# Patient Record
Sex: Male | Born: 1990 | Race: Black or African American | Hispanic: No | Marital: Single | Smoking: Current every day smoker
Health system: Southern US, Community
[De-identification: ages and names within clinical notes are randomized; demographics above are authoritative.]

## PROBLEM LIST (undated history)

## (undated) DIAGNOSIS — J45909 Unspecified asthma, uncomplicated: Secondary | ICD-10-CM

## (undated) HISTORY — PX: WRIST SURGERY: SHX841

---
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
## 2011-04-04 LAB — URINALYSIS, ROUTINE W REFLEX MICROSCOPIC
Glucose, UA: NEGATIVE
Hgb urine dipstick: NEGATIVE
Ketones, ur: >80 — AB
Leukocytes, UA: NEGATIVE
Nitrite: NEGATIVE
Protein, ur: >300 — AB
Specific Gravity, Urine: 1.041 — ABNORMAL HIGH
Urobilinogen, UA: 1
pH: 6

## 2011-04-04 LAB — URINE MICROSCOPIC-ADD ON

## 2012-10-05 ENCOUNTER — Emergency Department (HOSPITAL_COMMUNITY)
Admission: EM | Admit: 2012-10-05 | Discharge: 2012-10-06 | Disposition: A | Discharge status: Home / Self Care | Payer: Self-pay | Attending: Emergency Medicine | Admitting: Emergency Medicine

## 2012-10-05 ENCOUNTER — Encounter (HOSPITAL_COMMUNITY): Payer: Self-pay | Admitting: *Deleted

## 2012-10-05 DIAGNOSIS — R42 Dizziness and giddiness: Secondary | ICD-10-CM | POA: Insufficient documentation

## 2012-10-05 DIAGNOSIS — R5381 Other malaise: Secondary | ICD-10-CM | POA: Insufficient documentation

## 2012-10-05 DIAGNOSIS — R110 Nausea: Secondary | ICD-10-CM | POA: Insufficient documentation

## 2012-10-05 DIAGNOSIS — R61 Generalized hyperhidrosis: Secondary | ICD-10-CM | POA: Insufficient documentation

## 2012-10-05 DIAGNOSIS — I951 Orthostatic hypotension: Secondary | ICD-10-CM | POA: Insufficient documentation

## 2012-10-05 DIAGNOSIS — F17200 Nicotine dependence, unspecified, uncomplicated: Secondary | ICD-10-CM | POA: Insufficient documentation

## 2012-10-05 DIAGNOSIS — R55 Syncope and collapse: Secondary | ICD-10-CM | POA: Insufficient documentation

## 2012-10-05 NOTE — ED Provider Notes (Signed)
History     CSN: [id number]  Arrival date & time 10/05/12  2256   First MD Initiated Contact with Patient 10/05/12 2346      Chief Complaint  Patient presents with  . Loss of Consciousness    (Consider location/radiation/quality/duration/timing/severity/associated sxs/prior treatment) HPI Comments: Patient presents after syncopal episode at his mother's house. He reports sleeping his mother's bed and waking up feeling hot and sweaty. Without to the bathroom and fell lightheaded and broke out in a sweat. He sat on the toilet and then passed out. His mother caught him and he did not hit his head. Denies any chest pain or shortness of breath. Denies any seizure activity, tongue biting or urinary incontinence. Good by mouth intake and urine output. No palpitations, weakness, numbness or tingling, headache or vision change.  The history is provided by the patient and a parent.    History reviewed. No pertinent past medical history.  History reviewed. No pertinent past surgical history.  History reviewed. No pertinent family history.  History  Substance Use Topics  . Smoking status: Light Tobacco Smoker  . Smokeless tobacco: Not on file  . Alcohol Use: Yes      Review of Systems  Constitutional: Positive for diaphoresis. Negative for fever, activity change and appetite change.  HENT: Negative for congestion and rhinorrhea.   Respiratory: Negative for cough, chest tightness and shortness of breath.   Cardiovascular: Positive for syncope. Negative for chest pain.  Gastrointestinal: Negative for nausea, vomiting and abdominal pain.  Genitourinary: Negative for dysuria and hematuria.  Musculoskeletal: Negative for back pain.  Skin: Negative for rash.  Neurological: Positive for dizziness, weakness and light-headedness. Negative for headaches.  A complete 10 system review of systems was obtained and all systems are negative except as noted in the HPI and PMH.    Allergies   Review of patient's allergies indicates no known allergies.  Home Medications  No current outpatient prescriptions on file.  BP 119/57  Pulse 91  Temp(Src) 98.8 F (37.1 C) (Oral)  Resp 24  SpO2 98%  Physical Exam  Constitutional: He is oriented to person, place, and time. He appears well-developed and well-nourished. No distress.  HENT:  Head: Normocephalic and atraumatic.  Mouth/Throat: Oropharynx is clear and moist. No oropharyngeal exudate.  Eyes: Conjunctivae and EOM are normal. Pupils are equal, round, and reactive to light.  Neck: Normal range of motion. Neck supple.  Cardiovascular: Normal rate, regular rhythm and normal heart sounds.   No murmur heard. Pulmonary/Chest: Effort normal and breath sounds normal. No respiratory distress.  Abdominal: Soft. There is no tenderness. There is no rebound and no guarding.  Musculoskeletal: Normal range of motion. He exhibits no edema and no tenderness.  Neurological: He is alert and oriented to person, place, and time. No cranial nerve deficit. He exhibits normal muscle tone. Coordination normal.  CN 2-12 intact, 5/5 strength throughout, no ataxia on finger to nose.  Skin: Skin is warm.    ED Course  Procedures (including critical care time)  Labs Reviewed  CBC WITH DIFFERENTIAL - Abnormal; Notable for the following:    WBC 12.8 (*)    Neutrophils Relative 89 (*)    Neutro Abs 11.4 (*)    Lymphocytes Relative 5 (*)    All other components within normal limits  URINALYSIS, ROUTINE W REFLEX MICROSCOPIC - Abnormal; Notable for the following:    Specific Gravity, Urine 1.035 (*)    Protein, ur 30 (*)    All other components  within normal limits  URINE RAPID DRUG SCREEN (HOSP PERFORMED) - Abnormal; Notable for the following:    Tetrahydrocannabinol POSITIVE (*)    All other components within normal limits  COMPREHENSIVE METABOLIC PANEL  TROPONIN I  URINE MICROSCOPIC-ADD ON   Dg Chest 2 View  10/06/2012  *RADIOLOGY REPORT*   Clinical Data: Syncope.  CHEST - 2 VIEW  Comparison: 08/24/2004.  Findings:  Cardiopericardial silhouette within normal limits. Mediastinal contours normal. Trachea midline.  No airspace disease or effusion. Monitoring leads are projected over the chest.  IMPRESSION: No active cardiopulmonary disease.   Original Report Authenticated By: Andreas Newport, M.D.      1. Orthostatic hypotension   2. Syncope       MDM  Witnessed syncopal episode with prodrome of nausea, sweating, lightheadedness. Nonfocal neuro exam.  No arrhythmias an EKG. No Brugada. Patient tolerating by mouth in the ED. Orthostatics are positive. Electrolyte are within normal limits. He is feeling better, tolerating by mouth and ambulatory. THC +    Date: 10/06/2012  Rate: 74  Rhythm: normal sinus rhythm  QRS Axis: normal  Intervals: normal  ST/T Wave abnormalities: normal  Conduction Disutrbances:none  Narrative Interpretation: no Brugada, no prolonged QT  Old EKG Reviewed: none available    Glynn Octave, MD 10/06/12 [phone]

## 2012-10-06 ENCOUNTER — Emergency Department (HOSPITAL_COMMUNITY): Payer: Self-pay

## 2012-10-06 LAB — COMPREHENSIVE METABOLIC PANEL
ALT: 17 U/L (ref 0–53)
Albumin: 4.1 g/dL (ref 3.5–5.2)
Alkaline Phosphatase: 72 U/L (ref 39–117)
BUN: 16 mg/dL (ref 6–23)
Calcium: 9.2 mg/dL (ref 8.4–10.5)
Potassium: 3.9 mEq/L (ref 3.5–5.1)
Sodium: 135 mEq/L (ref 135–145)
Total Protein: 8.2 g/dL (ref 6.0–8.3)

## 2012-10-06 LAB — URINALYSIS, ROUTINE W REFLEX MICROSCOPIC
Bilirubin Urine: NEGATIVE
Nitrite: NEGATIVE
Specific Gravity, Urine: 1.035 — ABNORMAL HIGH (ref 1.005–1.030)
pH: 6.5 (ref 5.0–8.0)

## 2012-10-06 LAB — TROPONIN I: Troponin I: <0.3 ng/mL (ref <0.30)

## 2012-10-06 LAB — CBC WITH DIFFERENTIAL/PLATELET
Basophils Relative: 0 % (ref 0–1)
Eosinophils Absolute: 0 10*3/uL (ref 0.0–0.7)
MCH: 27.6 pg (ref 26.0–34.0)
MCHC: 34.3 g/dL (ref 30.0–36.0)
Neutrophils Relative %: 89 % — ABNORMAL HIGH (ref 43–77)
Platelets: 294 10*3/uL (ref 150–400)

## 2012-10-06 LAB — RAPID URINE DRUG SCREEN, HOSP PERFORMED
Barbiturates: NOT DETECTED
Benzodiazepines: NOT DETECTED
Cocaine: NOT DETECTED
Opiates: NOT DETECTED

## 2012-10-06 LAB — URINE MICROSCOPIC-ADD ON

## 2012-10-06 NOTE — ED Notes (Signed)
Pt reports feeling cold earlier this evening.  Pt went bathroom and pt became really hot and tried to stand up.  The next thing the pt remembers is someone calling his name.

## 2012-10-06 NOTE — ED Notes (Signed)
Pt able to tolerate sprite with no difficulties.

## 2013-01-21 ENCOUNTER — Encounter (HOSPITAL_COMMUNITY): Payer: Self-pay | Admitting: Emergency Medicine

## 2013-01-21 ENCOUNTER — Emergency Department (HOSPITAL_COMMUNITY): Payer: Self-pay

## 2013-01-21 ENCOUNTER — Emergency Department (HOSPITAL_COMMUNITY)
Admission: EM | Admit: 2013-01-21 | Discharge: 2013-01-21 | Disposition: A | Discharge status: Home / Self Care | Payer: Self-pay | Attending: Emergency Medicine | Admitting: Emergency Medicine

## 2013-01-21 DIAGNOSIS — R110 Nausea: Secondary | ICD-10-CM | POA: Insufficient documentation

## 2013-01-21 DIAGNOSIS — J069 Acute upper respiratory infection, unspecified: Secondary | ICD-10-CM | POA: Insufficient documentation

## 2013-01-21 DIAGNOSIS — R51 Headache: Secondary | ICD-10-CM | POA: Insufficient documentation

## 2013-01-21 DIAGNOSIS — J029 Acute pharyngitis, unspecified: Secondary | ICD-10-CM | POA: Insufficient documentation

## 2013-01-21 DIAGNOSIS — J45901 Unspecified asthma with (acute) exacerbation: Secondary | ICD-10-CM | POA: Insufficient documentation

## 2013-01-21 DIAGNOSIS — J3489 Other specified disorders of nose and nasal sinuses: Secondary | ICD-10-CM | POA: Insufficient documentation

## 2013-01-21 DIAGNOSIS — R197 Diarrhea, unspecified: Secondary | ICD-10-CM | POA: Insufficient documentation

## 2013-01-21 DIAGNOSIS — R05 Cough: Secondary | ICD-10-CM | POA: Insufficient documentation

## 2013-01-21 DIAGNOSIS — R059 Cough, unspecified: Secondary | ICD-10-CM | POA: Insufficient documentation

## 2013-01-21 DIAGNOSIS — B9789 Other viral agents as the cause of diseases classified elsewhere: Secondary | ICD-10-CM | POA: Insufficient documentation

## 2013-01-21 DIAGNOSIS — R0789 Other chest pain: Secondary | ICD-10-CM | POA: Insufficient documentation

## 2013-01-21 DIAGNOSIS — R509 Fever, unspecified: Secondary | ICD-10-CM | POA: Insufficient documentation

## 2013-01-21 DIAGNOSIS — Z79899 Other long term (current) drug therapy: Secondary | ICD-10-CM | POA: Insufficient documentation

## 2013-01-21 DIAGNOSIS — B349 Viral infection, unspecified: Secondary | ICD-10-CM

## 2013-01-21 HISTORY — DX: Unspecified asthma, uncomplicated: J45.909

## 2013-01-21 MED ORDER — GUAIFENESIN ER 600 MG PO TB12: 1200.0000 mg | ORAL_TABLET | Freq: Two times a day (BID) | ORAL | Status: DC

## 2013-01-21 MED ORDER — OXYMETAZOLINE HCL 0.05 % NA SOLN
1.0000 | Freq: Once | NASAL | Status: AC
  Administered 2013-01-21: 1 via NASAL
  Filled 2013-01-21: qty 15

## 2013-01-21 MED ORDER — IPRATROPIUM BROMIDE 0.03 % NA SOLN: 2.0000 | Freq: Two times a day (BID) | NASAL | Status: DC

## 2013-01-21 MED ORDER — PREDNISONE 20 MG PO TABS: 40.0000 mg | ORAL_TABLET | Freq: Every day | ORAL | Status: DC

## 2013-01-21 MED ORDER — BENZONATATE 100 MG PO CAPS: 100.0000 mg | ORAL_CAPSULE | Freq: Three times a day (TID) | ORAL | Status: DC

## 2013-01-21 NOTE — ED Provider Notes (Signed)
History    This chart was scribed for a non-physician practitioner working with Derek Munch, MD by Derek Good, ED scribe. This patient was seen in room WTR7/WTR7 and the patient's care was started at 6:13 PM.  CSN: [id number] Arrival date & time 01/21/13  1725    Chief Complaint  Patient presents with  . Generalized Body Aches  . Nausea  . Fever     The history is provided by the patient and medical records. No language interpreter was used.   HPI Comments: Derek Good is a 22 y.o. male with a hx of asthma who does not take daily medicationho presents to the Emergency Department complaining of pain to head and ears onset one day ago.  He complains of associated sore throat, cough, fever, nausea, diarrhea, chills and generalized body aches.  He reports taking zicam with some relief and nothing seems to make it worse.  Pt denies diaphoresis, vomiting, weakness, SOB and any other pain.   Past Medical History  Diagnosis Date  . Asthma    Past Surgical History  Procedure Laterality Date  . Wrist surgery     Family History  Problem Relation Age of Onset  . Asthma Mother    History  Substance Use Topics  . Smoking status: Light Tobacco Smoker  . Smokeless tobacco: Not on file  . Alcohol Use: Yes    Review of Systems  Constitutional: Positive for chills and fatigue. Negative for fever and appetite change.  HENT: Positive for congestion, sore throat, rhinorrhea, postnasal drip and sinus pressure. Negative for ear pain, mouth sores, neck stiffness and ear discharge.   Eyes: Negative for visual disturbance.  Respiratory: Positive for cough, chest tightness, shortness of breath and wheezing. Negative for stridor.   Cardiovascular: Negative for chest pain, palpitations and leg swelling.  Gastrointestinal: Positive for nausea and diarrhea. Negative for vomiting and abdominal pain.  Genitourinary: Negative for dysuria, urgency, frequency and hematuria.  Musculoskeletal:  Negative for myalgias, back pain and arthralgias.  Skin: Negative for rash.  Neurological: Positive for headaches. Negative for syncope, light-headedness and numbness.  Hematological: Negative for adenopathy.  Psychiatric/Behavioral: The patient is not nervous/anxious.   All other systems reviewed and are negative.    Allergies  Review of patient's allergies indicates no known allergies.  Home Medications   Current Outpatient Rx  Name  Route  Sig  Dispense  Refill  . benzonatate (TESSALON) 100 MG capsule   Oral   Take 1 capsule (100 mg total) by mouth every 8 (eight) hours.   21 capsule   0   . guaiFENesin (MUCINEX) 600 MG 12 hr tablet   Oral   Take 2 tablets (1,200 mg total) by mouth 2 (two) times daily.   30 tablet   0   . ipratropium (ATROVENT) 0.03 % nasal spray   Nasal   Place 2 sprays into the nose 2 (two) times daily. PRN congestion   30 mL   0    BP 127/62  Pulse 96  Temp(Src) 98.8 F (37.1 C) (Oral)  Wt 250 lb (113.399 kg)  SpO2 100% Physical Exam  Nursing note and vitals reviewed. Constitutional: He is oriented to person, place, and time. He appears well-developed and well-nourished. No distress.  Awake, alert, nontoxic appearance  HENT:  Head: Normocephalic and atraumatic.  Right Ear: Tympanic membrane, external ear and ear canal normal.  Left Ear: Tympanic membrane, external ear and ear canal normal.  Nose: Mucosal edema and rhinorrhea present.  No epistaxis. Right sinus exhibits no maxillary sinus tenderness and no frontal sinus tenderness. Left sinus exhibits no maxillary sinus tenderness and no frontal sinus tenderness.  Mouth/Throat: Uvula is midline and mucous membranes are normal. Mucous membranes are not pale and not cyanotic. Posterior oropharyngeal edema (mild) and posterior oropharyngeal erythema (mild) present. No oropharyngeal exudate or tonsillar abscesses.  Erythematous throat.  Some rhinorrhea and nasal congestion.  Eyes: Conjunctivae are  normal. Pupils are equal, round, and reactive to light. No scleral icterus.  Neck: Normal range of motion and full passive range of motion without pain. Neck supple.  Cardiovascular: Normal rate, regular rhythm, normal heart sounds and intact distal pulses.   No murmur heard. Pulmonary/Chest: Effort normal. No accessory muscle usage or stridor. Not tachypneic. No respiratory distress. He has decreased breath sounds. He has wheezes. He has no rhonchi. He has no rales.  Mild wheezing heard throughout.  Abdominal: Soft. Bowel sounds are normal. He exhibits no distension and no mass. There is no tenderness. There is no rebound and no guarding.  Musculoskeletal: Normal range of motion. He exhibits no edema.  Lymphadenopathy:    He has no cervical adenopathy.  Neurological: He is alert and oriented to person, place, and time. He exhibits normal muscle tone. Coordination normal.  Speech is clear and goal oriented Moves extremities without ataxia  Skin: Skin is warm and dry. No rash noted. He is not diaphoretic. No erythema.  Psychiatric: He has a normal mood and affect.    ED Course  Procedures (including critical care time) DIAGNOSTIC STUDIES: Oxygen Saturation is 100% on RA, normal by my interpretation.    COORDINATION OF CARE: 6:15 PM - Discussed ED treatment with pt at bedside including chest x-ray, albuterol, and decongestant and pt agrees.     Labs Reviewed - No data to display Dg Chest 2 View  01/21/2013   *RADIOLOGY REPORT*  Clinical Data: Cough and wheezing  CHEST - 2 VIEW  Comparison: 10/06/2012  Findings: Lung volume is normal.  The lungs are clear without infiltrate or effusion.  Heart size and vascularity are normal.  No mass lesion is identified.  IMPRESSION: Negative   Original Report Authenticated By: Janeece Riggers, M.D.   1. URI (upper respiratory infection)   2. Viral syndrome     MDM  Derek Good presents with URI symptoms and a Hx of asthma.  Pt CXR negative  for acute infiltrate. I personally reviewed the imaging tests through PACS system.  I reviewed available ER/hospitalization records through the EMR.  Patient ambulated in ED with O2 saturations maintained >90, no current signs of respiratory distress. Lung exam improved after albuterol treatment. Prednisone  5 day burst given along with symptomatic treatment. Pt states they are breathing at baseline. Patients symptoms are consistent with URI, likely viral etiology. Discussed that antibiotics are not indicated for viral infections. Pt will be discharged with symptomatic treatment.  Pt has been instructed to speak with PCP about today's exacerbation. Verbalizes understanding and is agreeable with plan. Pt is hemodynamically stable & in NAD prior to dc.  I personally performed the services described in this documentation, which was scribed in my presence. The recorded information has been reviewed and is accurate.     Dierdre Forth, PA-C 01/21/13 1857

## 2013-01-21 NOTE — ED Notes (Signed)
Pt reports fever. Nausea, body aches x 1 day. Tx with OTC meds

## 2013-01-21 NOTE — ED Provider Notes (Signed)
  Medical screening examination/treatment/procedure(s) were performed by non-physician practitioner and as supervising physician I was immediately available for consultation/collaboration.    Gerhard Munch, MD 01/21/13 2337

## 2013-06-27 ENCOUNTER — Emergency Department (HOSPITAL_COMMUNITY): Payer: Self-pay

## 2013-06-27 ENCOUNTER — Encounter (HOSPITAL_COMMUNITY): Payer: Self-pay | Admitting: Emergency Medicine

## 2013-06-27 ENCOUNTER — Emergency Department (HOSPITAL_COMMUNITY)
Admission: EM | Admit: 2013-06-27 | Discharge: 2013-06-27 | Disposition: A | Discharge status: Home / Self Care | Payer: Self-pay | Attending: Emergency Medicine | Admitting: Emergency Medicine

## 2013-06-27 DIAGNOSIS — S62319A Displaced fracture of base of unspecified metacarpal bone, initial encounter for closed fracture: Secondary | ICD-10-CM | POA: Insufficient documentation

## 2013-06-27 DIAGNOSIS — S62301A Unspecified fracture of second metacarpal bone, left hand, initial encounter for closed fracture: Secondary | ICD-10-CM

## 2013-06-27 DIAGNOSIS — R296 Repeated falls: Secondary | ICD-10-CM | POA: Insufficient documentation

## 2013-06-27 DIAGNOSIS — J45909 Unspecified asthma, uncomplicated: Secondary | ICD-10-CM | POA: Insufficient documentation

## 2013-06-27 DIAGNOSIS — S62202A Unspecified fracture of first metacarpal bone, left hand, initial encounter for closed fracture: Secondary | ICD-10-CM

## 2013-06-27 DIAGNOSIS — S62233A Other displaced fracture of base of first metacarpal bone, unspecified hand, initial encounter for closed fracture: Secondary | ICD-10-CM | POA: Insufficient documentation

## 2013-06-27 DIAGNOSIS — F17200 Nicotine dependence, unspecified, uncomplicated: Secondary | ICD-10-CM | POA: Insufficient documentation

## 2013-06-27 DIAGNOSIS — Y9367 Activity, basketball: Secondary | ICD-10-CM | POA: Insufficient documentation

## 2013-06-27 DIAGNOSIS — Y9239 Other specified sports and athletic area as the place of occurrence of the external cause: Secondary | ICD-10-CM | POA: Insufficient documentation

## 2013-06-27 MED ORDER — OXYCODONE-ACETAMINOPHEN 5-325 MG PO TABS
2.0000 | ORAL_TABLET | Freq: Once | ORAL | Status: AC
  Administered 2013-06-27: 2 via ORAL
  Filled 2013-06-27: qty 2

## 2013-06-27 MED ORDER — OXYCODONE-ACETAMINOPHEN 5-325 MG PO TABS: 1.0000 | ORAL_TABLET | Freq: Four times a day (QID) | ORAL | Status: DC | PRN

## 2013-06-27 NOTE — ED Notes (Signed)
Playing basketball and fell, landing on left hand injuring thumb and pointer finger. Swelling noted. CMS intact.

## 2013-06-27 NOTE — ED Notes (Signed)
Ortho tech paged  

## 2013-06-27 NOTE — ED Notes (Signed)
Pt playing basketball and fell on his left hand, unable to move two of his fingers, swelling present

## 2013-06-27 NOTE — ED Notes (Signed)
Ortho Tech at bedside.  

## 2013-06-27 NOTE — ED Provider Notes (Signed)
CSN: [id number]     Arrival date & time 06/27/13  1926 History   First MD Initiated Contact with Patient 06/27/13 2008     Chief Complaint  Patient presents with  . Hand Injury   HPI  History provided by the patient. Patient is a 22 year old male with past history of asthma who presents with complaints of left hand injury and pain. Patient was playing basketball lost his balance and fell backwards landing on his left hand. Patient believes his him it didn't twist it and initially struck the dorsal aspect. He has had pain near the base of his thumb and index finger of the hand. Pain is worse with any movements especially of the thumb. It has been also associated with swelling. He denies any weakness or numbness. He has put ice over the area with minimal improvement of swelling. No other aggravating or alleviating factors. No other injury from fall.   Past Medical History  Diagnosis Date  . Asthma    Past Surgical History  Procedure Laterality Date  . Wrist surgery     Family History  Problem Relation Age of Onset  . Asthma Mother    History  Substance Use Topics  . Smoking status: Light Tobacco Smoker  . Smokeless tobacco: Not on file  . Alcohol Use: Yes    Review of Systems  Neurological: Negative for weakness and numbness.  All other systems reviewed and are negative.    Allergies  Review of patient's allergies indicates no known allergies.  Home Medications   Current Outpatient Rx  Name  Route  Sig  Dispense  Refill  . Phenyleph-CPM-DM-APAP (TYLENOL COLD MULTI-SYMPTOM PO)   Oral   Take 1 tablet by mouth every 4 (four) hours as needed (cold symptoms.).          BP 121/70  Pulse 100  Temp(Src) 99.7 F (37.6 C) (Oral)  Resp 20  Wt 245 lb (111.131 kg)  SpO2 100% Physical Exam  Nursing note and vitals reviewed. Constitutional: He appears well-developed and well-nourished. No distress.  HENT:  Head: Normocephalic.  Cardiovascular: Normal rate and regular  rhythm.   Pulmonary/Chest: Effort normal and breath sounds normal. No respiratory distress.  Musculoskeletal: He exhibits edema and tenderness.  Significant reduced range of motion of the first and second digits of the left hand. There is swelling between the spacing of the left thumb and index finger. Normal distal sensation select touch normal capillary refill. No gross deformities.  Neurological: He is alert.  Skin: Skin is warm.  Psychiatric: He has a normal mood and affect. His behavior is normal.    ED Course  Procedures   DIAGNOSTIC STUDIES: Oxygen Saturation is 100% on room air.    COORDINATION OF CARE:  Nursing notes reviewed. Vital signs reviewed. Initial pt interview and examination performed.   9:00 p.m. patient seen and evaluated. Patient appears in some discomfort no acute distress. Discussed work up plan with pt at bedside, which includes x-rays. Pt agrees with plan.  9:20 PM spoke with Dr. Melvyn Novas with orthopedic hand. He would like the patient placed in a splints and to followup in the office tomorrow.  Treatment plan initiated: Medications  oxyCODONE-acetaminophen (PERCOCET/ROXICET) 5-325 MG per tablet 2 tablet (2 tablets Oral Given 06/27/13 2056)   Imaging Review Dg Hand Complete Left  06/27/2013   CLINICAL DATA:  Larey Seat playing basketball.  Pain and swelling.  EXAM: LEFT HAND - COMPLETE 3+ VIEW  COMPARISON:  None.  FINDINGS: There are nondisplaced  fractures involving the base of the 1st and 2nd metacarpals. The remaining bony structures are intact.  IMPRESSION: First and 2nd metacarpal base fractures.   Electronically Signed   By: Loralie Champagne M.D.   On: 06/27/2013 20:33      MDM   1. First metacarpal bone fracture, left, closed, initial encounter   2. Fracture of second metacarpal bone of left hand, closed, initial encounter        Angus Seller, PA-C 06/28/13 [phone]

## 2013-06-27 NOTE — ED Notes (Signed)
Patient transported to X-ray 

## 2013-06-28 NOTE — ED Provider Notes (Signed)
Medical screening examination/treatment/procedure(s) were performed by non-physician practitioner and as supervising physician I was immediately available for consultation/collaboration.  EKG Interpretation   None         William Lakeem Rozo, MD 06/28/13 1655 

## 2013-06-29 ENCOUNTER — Encounter: Payer: Self-pay | Admitting: Internal Medicine

## 2013-06-29 ENCOUNTER — Ambulatory Visit: Payer: Self-pay | Attending: Internal Medicine | Admitting: Internal Medicine

## 2013-06-29 VITALS — BP 132/80 | HR 70 | Temp 98.6°F | Resp 16 | Ht 76.0 in | Wt 246.0 lb

## 2013-06-29 DIAGNOSIS — IMO0001 Reserved for inherently not codable concepts without codable children: Secondary | ICD-10-CM

## 2013-06-29 DIAGNOSIS — S42309S Unspecified fracture of shaft of humerus, unspecified arm, sequela: Secondary | ICD-10-CM

## 2013-06-29 DIAGNOSIS — S62301S Unspecified fracture of second metacarpal bone, left hand, sequela: Secondary | ICD-10-CM

## 2013-06-29 DIAGNOSIS — S62301A Unspecified fracture of second metacarpal bone, left hand, initial encounter for closed fracture: Secondary | ICD-10-CM | POA: Insufficient documentation

## 2013-06-29 DIAGNOSIS — Z23 Encounter for immunization: Secondary | ICD-10-CM

## 2013-06-29 DIAGNOSIS — M79642 Pain in left hand: Secondary | ICD-10-CM

## 2013-06-29 DIAGNOSIS — S62202A Unspecified fracture of first metacarpal bone, left hand, initial encounter for closed fracture: Secondary | ICD-10-CM | POA: Insufficient documentation

## 2013-06-29 DIAGNOSIS — M79609 Pain in unspecified limb: Secondary | ICD-10-CM | POA: Insufficient documentation

## 2013-06-29 DIAGNOSIS — F17200 Nicotine dependence, unspecified, uncomplicated: Secondary | ICD-10-CM | POA: Insufficient documentation

## 2013-06-29 DIAGNOSIS — Z139 Encounter for screening, unspecified: Secondary | ICD-10-CM

## 2013-06-29 DIAGNOSIS — S62202S Unspecified fracture of first metacarpal bone, left hand, sequela: Secondary | ICD-10-CM

## 2013-06-29 LAB — COMPLETE METABOLIC PANEL WITH GFR
Albumin: 4.3 g/dL (ref 3.5–5.2)
BUN: 11 mg/dL (ref 6–23)
CO2: 22 mEq/L (ref 19–32)
Calcium: 9.4 mg/dL (ref 8.4–10.5)
Chloride: 103 mEq/L (ref 96–112)
GFR, Est Non African American: >89 mL/min
Glucose, Bld: 84 mg/dL (ref 70–99)
Potassium: 3.9 mEq/L (ref 3.5–5.3)
Sodium: 136 mEq/L (ref 135–145)
Total Protein: 7.5 g/dL (ref 6.0–8.3)

## 2013-06-29 LAB — CBC WITH DIFFERENTIAL/PLATELET
HCT: 42.5 % (ref 39.0–52.0)
Hemoglobin: 14.6 g/dL (ref 13.0–17.0)
Lymphocytes Relative: 16 % (ref 12–46)
Lymphs Abs: 1.3 10*3/uL (ref 0.7–4.0)
MCHC: 34.4 g/dL (ref 30.0–36.0)
Monocytes Absolute: 1.2 10*3/uL — ABNORMAL HIGH (ref 0.1–1.0)
Monocytes Relative: 15 % — ABNORMAL HIGH (ref 3–12)
Neutro Abs: 5.7 10*3/uL (ref 1.7–7.7)
Neutrophils Relative %: 67 % (ref 43–77)
RBC: 5.43 MIL/uL (ref 4.22–5.81)
WBC: 8.4 10*3/uL (ref 4.0–10.5)

## 2013-06-29 LAB — LIPID PANEL
HDL: 38 mg/dL — ABNORMAL LOW (ref >39)
LDL Cholesterol: 90 mg/dL (ref 0–99)
Total CHOL/HDL Ratio: 3.6 Ratio

## 2013-06-29 LAB — TSH: TSH: 0.961 u[IU]/mL (ref 0.350–4.500)

## 2013-06-29 MED ORDER — IBUPROFEN 800 MG PO TABS: 800.0000 mg | ORAL_TABLET | Freq: Three times a day (TID) | ORAL | Status: DC | PRN

## 2013-06-29 NOTE — Progress Notes (Signed)
MRN: [id number] Name: Derek Good  Sex: male Age: 22 y.o. DOB: [birth year]  Allergies: Review of patient's allergies indicates no known allergies.  Chief Complaint  Patient presents with  . Establish Care    HPI: Patient is 22 y.o. male  comes for the first time to establish medical care, he recently went to the emergency room with left hand pain as patient was playing basket ball and fell on his hand, EMR reviewed patient had an x-ray done reported to have nondisplaced fractures involving the base of the 1st and 2nd metacarpals.   Hand was splinted, patient was advised to see orthopedics as patient did not had insurance could not see the orthopedic, he was prescribed narcotic pain medication has not required to use it advised patient that if he develops mild-to-moderate pain I will prescribe him ibuprofen to take. Narcotic medication he will only use for severe pain.   Past Medical History  Diagnosis Date  . Asthma     Past Surgical History  Procedure Laterality Date  . Wrist surgery        Medication List       This list is accurate as of: 06/29/13 11:38 AM.  Always use your most recent med list.               ibuprofen 800 MG tablet  Commonly known as:  ADVIL,MOTRIN  Take 1 tablet (800 mg total) by mouth every 8 (eight) hours as needed.     oxyCODONE-acetaminophen 5-325 MG per tablet  Commonly known as:  PERCOCET/ROXICET  Take 1-2 tablets by mouth every 6 (six) hours as needed for severe pain.     TYLENOL COLD MULTI-SYMPTOM PO  Take 1 tablet by mouth every 4 (four) hours as needed (cold symptoms.).        Meds ordered this encounter  Medications  . ibuprofen (ADVIL,MOTRIN) 800 MG tablet    Sig: Take 1 tablet (800 mg total) by mouth every 8 (eight) hours as needed.    Dispense:  60 tablet    Refill:  1     There is no immunization history on file for this patient.  Family History  Problem Relation Age of Onset  . Asthma Mother     History   Substance Use Topics  . Smoking status: Light Tobacco Smoker    Types: Cigarettes, Cigars  . Smokeless tobacco: Not on file  . Alcohol Use: Yes    Review of Systems  As noted in HPI  Filed Vitals:   06/29/13 1113  BP: 132/80  Pulse: 70  Temp: 98.6 F (37 C)  Resp: 16    Physical Exam  Physical Exam  Constitutional: No distress.  Eyes: EOM are normal. Pupils are equal, round, and reactive to light.  Cardiovascular: Normal rate and regular rhythm.   Pulmonary/Chest: Breath sounds normal. No respiratory distress. He has no wheezes. He has no rales.  Musculoskeletal:  Left hand in splint patient can move the fingers denies any pain, good capillary refill     CBC    Component Value Date/Time   WBC 12.8* 10/06/2012 0008   RBC 5.72 10/06/2012 0008   HGB 15.8 10/06/2012 0008   HCT 46.0 10/06/2012 0008   PLT 294 10/06/2012 0008   MCV 80.4 10/06/2012 0008   LYMPHSABS 0.7 10/06/2012 0008   MONOABS 0.7 10/06/2012 0008   EOSABS 0.0 10/06/2012 0008   BASOSABS 0.0 10/06/2012 0008    CMP     Component Value Date/Time  NA 135 10/06/2012 0008   K 3.9 10/06/2012 0008   CL 100 10/06/2012 0008   CO2 25 10/06/2012 0008   GLUCOSE 95 10/06/2012 0008   BUN 16 10/06/2012 0008   CREATININE 0.91 10/06/2012 0008   CALCIUM 9.2 10/06/2012 0008   PROT 8.2 10/06/2012 0008   ALBUMIN 4.1 10/06/2012 0008   AST 18 10/06/2012 0008   ALT 17 10/06/2012 0008   ALKPHOS 72 10/06/2012 0008   BILITOT 0.7 10/06/2012 0008   GFRNONAA >90 10/06/2012 0008   GFRAA >90 10/06/2012 0008    No results found for this basename: chol, tri, ldl    No components found with this basename: hga1c    Lab Results  Component Value Date/Time   AST 18 10/06/2012 12:08 AM    Assessment and Plan  Fracture of metacarpal, first, left hand, sequela - Plan: Ambulatory referral to Orthopedic Surgery Fracture of second metacarpal bone of left hand, sequela - Plan: Ambulatory referral to Orthopedic Surgery  Smoking  advised to quit  smoking.  Needs flu shot  Screening - Plan: CBC with Differential, COMPLETE METABOLIC PANEL WITH GFR, TSH, Lipid panel, Vit D  25 hydroxy (rtn osteoporosis monitoring)  Left hand pain - Plan: ibuprofen (ADVIL,MOTRIN) 800 MG tablet for mild to moderate pain, Percocet only for severe pain.   Health Maintenance  Return in about 5 weeks (around 08/03/2013).  Doris Cheadle, MD

## 2013-06-29 NOTE — Progress Notes (Signed)
Patient here to establish care Broke his hand a few days ago playing basketball Needs referral to ortho In the process of applying for orange card

## 2013-06-30 ENCOUNTER — Telehealth: Payer: Self-pay | Admitting: *Deleted

## 2013-06-30 ENCOUNTER — Telehealth: Payer: Self-pay | Admitting: Emergency Medicine

## 2013-06-30 ENCOUNTER — Telehealth: Payer: Self-pay

## 2013-06-30 ENCOUNTER — Other Ambulatory Visit: Payer: Self-pay | Admitting: Emergency Medicine

## 2013-06-30 LAB — VITAMIN D 25 HYDROXY (VIT D DEFICIENCY, FRACTURES): Vit D, 25-Hydroxy: <10 ng/mL — ABNORMAL LOW (ref 30–89)

## 2013-06-30 MED ORDER — VITAMIN D (ERGOCALCIFEROL) 1.25 MG (50000 UNIT) PO CAPS: 50000.0000 [IU] | ORAL_CAPSULE | ORAL | Status: DC

## 2013-06-30 NOTE — Telephone Encounter (Signed)
Pt returning call. Labs given. Pt instructed with new script Vitamin D sent to [facility]

## 2013-06-30 NOTE — Telephone Encounter (Signed)
Spoke with patient about his referrel We referred  Him to [facility] but he can not afford  The required $250.00 payment . i told him he could talk with Arna Medici  When he comes to pick up his prescription

## 2013-06-30 NOTE — Telephone Encounter (Signed)
Contacted pt to notify of lab results. Pt did not answer. Left a voicemail to please give [place] a call back.

## 2013-06-30 NOTE — Telephone Encounter (Signed)
Message copied by Lestine Mount on Thu Jun 30, 2013 12:43 PM ------      Message from: Doris Cheadle      Created: Thu Jun 30, 2013  9:16 AM       Blood work reviewed, noticed low vitamin D, call patient advise to start ergocalciferol 50,000 units once a week for the duration of  12 weeks.       ------

## 2013-07-11 ENCOUNTER — Emergency Department (HOSPITAL_COMMUNITY)
Admission: EM | Admit: 2013-07-11 | Discharge: 2013-07-11 | Disposition: A | Discharge status: Home / Self Care | Payer: Self-pay | Attending: Emergency Medicine | Admitting: Emergency Medicine

## 2013-07-11 ENCOUNTER — Encounter (HOSPITAL_COMMUNITY): Payer: Self-pay | Admitting: Emergency Medicine

## 2013-07-11 DIAGNOSIS — F17200 Nicotine dependence, unspecified, uncomplicated: Secondary | ICD-10-CM | POA: Insufficient documentation

## 2013-07-11 DIAGNOSIS — J45909 Unspecified asthma, uncomplicated: Secondary | ICD-10-CM | POA: Insufficient documentation

## 2013-07-11 DIAGNOSIS — S5290XD Unspecified fracture of unspecified forearm, subsequent encounter for closed fracture with routine healing: Secondary | ICD-10-CM | POA: Insufficient documentation

## 2013-07-11 DIAGNOSIS — Z79899 Other long term (current) drug therapy: Secondary | ICD-10-CM | POA: Insufficient documentation

## 2013-07-11 DIAGNOSIS — S62308S Unspecified fracture of other metacarpal bone, sequela: Secondary | ICD-10-CM

## 2013-07-11 DIAGNOSIS — S62202S Unspecified fracture of first metacarpal bone, left hand, sequela: Secondary | ICD-10-CM

## 2013-07-11 NOTE — ED Notes (Signed)
Patient states that he fractured 1st and 2nd metacarpal 2 weeks ago and went to [facility] at that time.   Patient had xrays done at [facility].   Patient states he went to health and wellness today to get a work note and they told him to come to ED for "a hard wrap and a note for work".

## 2013-07-11 NOTE — Progress Notes (Signed)
Orthopedic Tech Progress Note Patient Details:  Derek Good 17-Aug-1990 [id number]  Ortho Devices Type of Ortho Device: Ace wrap;Volar splint Ortho Device/Splint Location: lue Ortho Device/Splint Interventions: Application   [name] 07/11/2013, 5:47 PM

## 2013-07-11 NOTE — ED Provider Notes (Signed)
CSN: [id number]     Arrival date & time 07/11/13  1437 History   This chart was scribed for non-physician practitioner, Raymon Mutton, PA-C, working with Doug Sou, MD, by Yevette Edwards, ED Scribe. This patient was seen in room TR10C/TR10C and the patient's care was started at 4:48 PM.     Chief Complaint  Patient presents with  . fractured hand    The history is provided by the patient. No language interpreter was used.   HPI Comments: Derek Good is a 22 y.o. male who presents to the Emergency Department complaining of fractures to his first and second metacarpals of his left hand which were treated two weeks ago on December 15th, but for which he now needs a hard cast.  He states he has experienced improvement to the pain; he has been treating the pain with 800 mg IBU and percocet. The pt denies weakness to the hand, numbness, paresthesia, or additional injuries or falls. The pt also requests a work note.  After the initial injury two weeks ago, he followed-up with [facility], but he was unable to afford the cost of the appointment. The pt was referred to the ED by [facility] to see if a hard cast or another splint could be placed. He is a current smoker.   Past Medical History  Diagnosis Date  . Asthma    Past Surgical History  Procedure Laterality Date  . Wrist surgery     Family History  Problem Relation Age of Onset  . Asthma Mother    History  Substance Use Topics  . Smoking status: Current Some Day Smoker    Types: Cigarettes, Cigars  . Smokeless tobacco: Not on file  . Alcohol Use: Yes    Review of Systems  Constitutional: Negative for fever.  Musculoskeletal: Positive for arthralgias.  Neurological: Negative for weakness and numbness.  All other systems reviewed and are negative.    Allergies  Review of patient's allergies indicates no known allergies.  Home Medications   Current Outpatient Rx  Name  Route  Sig  Dispense   Refill  . ibuprofen (ADVIL,MOTRIN) 800 MG tablet   Oral   Take 1 tablet (800 mg total) by mouth every 8 (eight) hours as needed.   60 tablet   1   . oxyCODONE-acetaminophen (PERCOCET/ROXICET) 5-325 MG per tablet   Oral   Take 1-2 tablets by mouth every 6 (six) hours as needed for severe pain.   20 tablet   0   . Vitamin D, Ergocalciferol, (DRISDOL) 50000 UNITS CAPS capsule   Oral   Take 50,000 Units by mouth every 7 (seven) days.          Triage Vitals: BP 119/67  Pulse 80  Temp(Src) 97.3 F (36.3 C) (Oral)  Resp 16  SpO2 96%  Physical Exam  Nursing note and vitals reviewed. Constitutional: He is oriented to person, place, and time. He appears well-developed and well-nourished. No distress.  HENT:  Head: Normocephalic and atraumatic.  Eyes: EOM are normal.  Neck: Normal range of motion. Neck supple.  Cardiovascular: Normal rate, regular rhythm and normal heart sounds.  Exam reveals no friction rub.   No murmur heard. Pulses:      Radial pulses are 2+ on the right side.  Pulmonary/Chest: Effort normal and breath sounds normal. No respiratory distress. He has no wheezes. He has no rales.  Musculoskeletal: Normal range of motion.  Negative swelling, erythema, inflammation, lesions, sores, changes to colors noted  to the digits of the left hand. Patient currently in short arm splint to the left hand. Full range of motion noted to the digits of the left hip without difficulty. Full supination, pronation noted to the left upper extremities. Full range of motion noted to the left elbow, left shoulder.  Neurological: He is alert and oriented to person, place, and time. He exhibits normal muscle tone. Coordination normal.  Strength intact to MCP, PIP, DIP joints of the left hand. Sensation intact with differentiation to sharp and dull touch  Skin: Skin is warm and dry.  Psychiatric: He has a normal mood and affect. His behavior is normal.    ED Course  Procedures (including  critical care time)  DIAGNOSTIC STUDIES: Oxygen Saturation is 96% on room air, adequate by my interpretation.    COORDINATION OF CARE:  4:53 PM- Discussed treatment plan with patient, and the patient agreed to the plan.   4:55 PM This provider consulted with Dr. Jinger Neighbors who reported that hard casts are not normally placed in the ED setting, unless patient is seen by orthopedics and orthopedics places the hard cast on in the office. Dr. Jinger Neighbors recommended to consult with orthopedics patient was referred to.  5:04 PM This provider spoke with Drue Flirt, receptionist of [facility] orthopedics-discussed scenario and situation regarding patient being unable to afford followup. He reported the patient called and set up an appointment for 06/29/2013-reported that the patient called on 07/01/2013 reporting that he was going to be seen by the community health review is going to receive free care without charge. Candy reported that patient needs to pay $250 in order to be seen-patient cannot be seen without co-pay.   5:17 PM- Rechecked pt.   Labs Review Labs Reviewed - No data to display Imaging Review No results found.  EKG Interpretation   None       MDM   1. Fracture of metacarpal, first, left hand, sequela   2. Fracture of second metacarpal bone, sequela     Filed Vitals:   07/11/13 1453  BP: 119/67  Pulse: 80  Temp: 97.3 F (36.3 C)  TempSrc: Oral  Resp: 16  SpO2: 96%   I personally performed the services described in this documentation, which was scribed in my presence. The recorded information has been reviewed and is accurate.  Patient presenting to emergency department with first and second metacarpal base fractures that were identified on 06/27/2013-patient was placed in a short arm splint and referred to [facility] orthopedics. Patient presents today with request for hard cast to be placed and for work note. Patient reports that he was due to be seen at [facility]  orthopedics, but was unable to afford the co-pay. Patient reported that he canceled his appointment with [facility] orthopedics was following up with community health where he was receiving free care. Patient reports that he was told by [facility] to get a hard cast placed/change splint-recommended to come to emergency department for this. Alert and oriented. GCS 15. Heart rate and rhythm normal. Lungs clear to auscultation to upper and lower lobes. Negative swelling, erythema, inflammation, lesions, sores, changes in color noted to the digits of left hand. Full range of motion to the digits noted. Strength intact MCP, PIP, DIP joints of the left hand. Sensation intact. Strength intact with equal distribution. Pulses palpable. Patient currently in short arm splint. This provider spoke with [facility] orthopedics receptionist-reported that patient was due to be seen in the office on 07/01/2013 reported  $250  co-pay required - receptionist reported that patient called and canceled appointment and stated that he was going to seen community health center. Receptionist reported that he needs to pay co-pay.  Splint replaced. Patient stable, afebrile. Discharge patient to follow, and [facility]. Discussed with patient to rest, ice. Discussed with patient to avoid any physical stress activity. Work noted given. Discussed with patient to closely monitor symptoms and if symptoms are to worsen or change to report back to the ED - strict return instructions given.  Patient agreed to plan of care, understood, all questions answered.     Raymon Mutton, PA-C 07/13/13 1402

## 2013-07-11 NOTE — ED Notes (Signed)
Ortho tech here to change cast

## 2013-07-13 NOTE — ED Provider Notes (Signed)
Medical screening examination/treatment/procedure(s) were performed by non-physician practitioner and as supervising physician I was immediately available for consultation/collaboration.  EKG Interpretation   None        Arleta Ostrum, MD 07/13/13 2018 

## 2013-08-09 ENCOUNTER — Ambulatory Visit: Payer: Self-pay

## 2014-09-20 ENCOUNTER — Emergency Department (HOSPITAL_COMMUNITY)
Admission: EM | Admit: 2014-09-20 | Discharge: 2014-09-20 | Disposition: A | Discharge status: Home / Self Care | Payer: Self-pay | Attending: Emergency Medicine | Admitting: Emergency Medicine

## 2014-09-20 ENCOUNTER — Encounter (HOSPITAL_COMMUNITY): Payer: Self-pay | Admitting: Emergency Medicine

## 2014-09-20 DIAGNOSIS — Z720 Tobacco use: Secondary | ICD-10-CM | POA: Insufficient documentation

## 2014-09-20 DIAGNOSIS — J45909 Unspecified asthma, uncomplicated: Secondary | ICD-10-CM | POA: Insufficient documentation

## 2014-09-20 DIAGNOSIS — M722 Plantar fascial fibromatosis: Secondary | ICD-10-CM | POA: Insufficient documentation

## 2014-09-20 NOTE — ED Notes (Signed)
Pt made aware to return if symptoms worsen or if any life threatening symptoms occur.   

## 2014-09-20 NOTE — ED Provider Notes (Signed)
CSN: [id number]     Arrival date & time 09/20/14  1620 History  This chart was scribed for non-physician practitioner working with Noel GeroldMatt Gentry, MD by Richarda Overlieichard Holland, ED Scribe. This patient was seen in room TR05C/TR05C and the patient's care was started at 6:00 PM.  Chief Complaint  Patient presents with  . Foot Pain   The history is provided by the patient. No language interpreter was used.   HPI Comments: Derek Good is a 24 y.o. male with a history of asthma who presents to the Emergency Department complaining of bilateral foot pain for the last week. Pt denies any injury. 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 He reports that he has pain on the lateral sides of his feet and in the bottom of his heels. He says that his pain is the worst in the morning. Pt states that he has tried taking ibuprofen which has provided minimal pain relief. He denies fevers, chills, nausea, vomiting, numbness and tingling.   Past Medical History  Diagnosis Date  . Asthma    Past Surgical History  Procedure Laterality Date  . Wrist surgery     Family History  Problem Relation Age of Onset  . Asthma Mother    History  Substance Use Topics  . Smoking status: Current Some Day Smoker    Types: Cigarettes, Cigars  . Smokeless tobacco: Not on file  . Alcohol Use: Yes    Review of Systems  Constitutional: Negative for fever and chills.  Gastrointestinal: Negative for nausea and vomiting.  Musculoskeletal: Positive for arthralgias. Negative for gait problem.  Skin: Negative for color change and rash.  Neurological: Negative for weakness and numbness.   Allergies  Review of patient's allergies indicates no known allergies.  Home Medications   Prior to Admission medications   Medication Sig Start Date End Date Taking? Authorizing Provider  ibuprofen (ADVIL,MOTRIN) 800 MG tablet Take 1 tablet (800 mg total) by mouth every 8 (eight) hours as needed.  06/29/13   Doris Cheadleeepak Advani, MD  oxyCODONE-acetaminophen (PERCOCET/ROXICET) 5-325 MG per tablet Take 1-2 tablets by mouth every 6 (six) hours as needed for severe pain. 06/27/13   Ivonne AndrewPeter Dammen, PA-C  Vitamin D, Ergocalciferol, (DRISDOL) 50000 UNITS CAPS capsule Take 50,000 Units by mouth every 7 (seven) days. 06/30/13   Doris Cheadleeepak Advani, MD   BP 137/82 mmHg  Pulse 84  Temp(Src) 98.4 F (36.9 C) (Oral)  Resp 18  SpO2 100% Physical Exam  Constitutional: He appears well-developed and well-nourished. No distress.  HENT:  Head: Normocephalic and atraumatic.  Eyes: Conjunctivae are normal. Right eye exhibits no discharge. Left eye exhibits no discharge.  Pulmonary/Chest: Effort normal. No respiratory distress.  Musculoskeletal:  Left ankle: Tenderness to plantar heel and lateral side of foot. No ecchymoses, warmth, redness. Full range of motion without tenderness. No tenderness to right proximal fibular head. No tenderness over base of fifth metatarsal or navicular. No tenderness of lateral or medial malleolus. Right ankle: minimal tenderness to plantar heel. No ecchymoses, warmth, redness. Full range of motion without tenderness. No tenderness to right proximal fibular head. No tenderness over base of fifth metatarsal or navicular. No tenderness of lateral or medial malleolus.  Neurological: He is alert. Coordination normal.  Skin: He is not diaphoretic.  Psychiatric: He has a normal mood and affect. His behavior is normal.  Nursing note and vitals reviewed.   ED Course  Procedures   DIAGNOSTIC STUDIES:  Oxygen Saturation is 98% on RA, normal by my interpretation.    COORDINATION OF CARE: 6:05 PM Discussed treatment plan with pt at bedside and pt agreed to plan.   Labs Review Labs Reviewed - No data to display  Imaging Review No results found.   EKG Interpretation None      MDM   Final diagnoses:  Plantar fasciitis of left foot   Pt presenting with bilateral atraumatic foot pain  that is worse with the first step in the morning and is localized to entire heel worse on left. Full range of motion no swelling or erythema. Neurovascularly intact. Foot does not require x-rays due to [place] ankle rules. Patient given Spandage instructed and rice and NSAID use. He is also instructed in stretching exercises. He is to follow-up with orthopedics for persistent or worsening symptoms.  Discussed return precautions with patient. Discussed all results and patient verbalizes understanding and agrees with plan.  I personally performed the services described in this documentation, which was scribed in my presence. The recorded information has been reviewed and is accurate.   Oswaldo Conroy, PA-C 09/20/14 1910  Mirian Mo, MD 09/20/14 [phone]

## 2014-09-20 NOTE — ED Notes (Signed)
Pt c/o bilateral foot pain with some possible swelling; pt sts works standing on his feet most of the day

## 2014-09-20 NOTE — Discharge Instructions (Signed)
Return to the emergency room with worsening of symptoms, new symptoms or with symptoms that are concerning, especially swelling, fevers, redness, numbness, tingling or weakness. RICE: Rest, Ice (three cycles of 20 mins on, off at least twice a day), compression/brace, elevation. Heating pad works well for back pain. Ibuprofen  (2 tablets ) every 5-6 hours for 3-5 days. Follow up with PCP/orthopedist if symptoms worsen or are persistent. Read below information and follow recommendations.  Plantar Fasciitis (Heel Spur Syndrome) with Rehab The plantar fascia is a fibrous, ligament-like, soft-tissue structure that spans the bottom of the foot. Plantar fasciitis is a condition that causes pain in the foot due to inflammation of the tissue. SYMPTOMS   Pain and tenderness on the underneath side of the foot.  Pain that worsens with standing or walking. CAUSES  Plantar fasciitis is caused by irritation and injury to the plantar fascia on the underneath side of the foot. Common mechanisms of injury include:  Direct trauma to bottom of the foot.  Damage to a small nerve that runs under the foot where the main fascia attaches to the heel bone.  Stress placed on the plantar fascia due to bone spurs. RISK INCREASES WITH:   Activities that place stress on the plantar fascia (running, jumping, pivoting, or cutting).  Poor strength and flexibility.  Improperly fitted shoes.  Tight calf muscles.  Flat feet.  Failure to warm-up properly before activity.  Obesity. PREVENTION  Warm up and stretch properly before activity.  Allow for adequate recovery between workouts.  Maintain physical fitness:  Strength, flexibility, and endurance.  Cardiovascular fitness.  Maintain a health body weight.  Avoid stress on the plantar fascia.  Wear properly fitted shoes, including arch supports for individuals who have flat feet. PROGNOSIS  If treated properly, then the symptoms of  plantar fasciitis usually resolve without surgery. However, occasionally surgery is necessary. RELATED COMPLICATIONS   Recurrent symptoms that may result in a chronic condition.  Problems of the lower back that are caused by compensating for the injury, such as limping.  Pain or weakness of the foot during push-off following surgery.  Chronic inflammation, scarring, and partial or complete fascia tear, occurring more often from repeated injections. TREATMENT  Treatment initially involves the use of ice and medication to help reduce pain and inflammation. The use of strengthening and stretching exercises may help reduce pain with activity, especially stretches of the Achilles tendon. These exercises may be performed at home or with a therapist. Your caregiver may recommend that you use heel cups of arch supports to help reduce stress on the plantar fascia. Occasionally, corticosteroid injections are given to reduce inflammation. If symptoms persist for greater than 6 months despite non-surgical (conservative), then surgery may be recommended.  MEDICATION   If pain medication is necessary, then nonsteroidal anti-inflammatory medications, such as aspirin and ibuprofen, or other minor pain relievers, such as acetaminophen, are often recommended.  Do not take pain medication within 7 days before surgery.  Prescription pain relievers may be given if deemed necessary by your caregiver. Use only as directed and only as much as you need.  Corticosteroid injections may be given by your caregiver. These injections should be reserved for the most serious cases, because they may only be given a certain number of times. HEAT AND COLD  Cold treatment (icing) relieves pain and reduces inflammation. Cold treatment should be applied for 10 to 15 minutes every 2 to 3 hours for inflammation and pain and immediately after any  activity that aggravates your symptoms. Use ice packs or massage the area with a piece of  ice (ice massage).  Heat treatment may be used prior to performing the stretching and strengthening activities prescribed by your caregiver, physical therapist, or athletic trainer. Use a heat pack or soak the injury in warm water. SEEK IMMEDIATE MEDICAL CARE IF:  Treatment seems to offer no benefit, or the condition worsens.  Any medications produce adverse side effects. EXERCISES RANGE OF MOTION (ROM) AND STRETCHING EXERCISES - Plantar Fasciitis (Heel Spur Syndrome) These exercises may help you when beginning to rehabilitate your injury. Your symptoms may resolve with or without further involvement from your physician, physical therapist or athletic trainer. While completing these exercises, remember:   Restoring tissue flexibility helps normal motion to return to the joints. This allows healthier, less painful movement and activity.  An effective stretch should be held for at least 30 seconds.  A stretch should never be painful. You should only feel a gentle lengthening or release in the stretched tissue. RANGE OF MOTION - Toe Extension, Flexion  Sit with your right / left leg crossed over your opposite knee.  Grasp your toes and gently pull them back toward the top of your foot. You should feel a stretch on the bottom of your toes and/or foot.  Hold this stretch for __________ seconds.  Now, gently pull your toes toward the bottom of your foot. You should feel a stretch on the top of your toes and or foot.  Hold this stretch for __________ seconds. Repeat __________ times. Complete this stretch __________ times per day.  RANGE OF MOTION - Ankle Dorsiflexion, Active Assisted  Remove shoes and sit on a chair that is preferably not on a carpeted surface.  Place right / left foot under knee. Extend your opposite leg for support.  Keeping your heel down, slide your right / left foot back toward the chair until you feel a stretch at your ankle or calf. If you do not feel a stretch,  slide your bottom forward to the edge of the chair, while still keeping your heel down.  Hold this stretch for __________ seconds. Repeat __________ times. Complete this stretch __________ times per day.  STRETCH - Gastroc, Standing  Place hands on wall.  Extend right / left leg, keeping the front knee somewhat bent.  Slightly point your toes inward on your back foot.  Keeping your right / left heel on the floor and your knee straight, shift your weight toward the wall, not allowing your back to arch.  You should feel a gentle stretch in the right / left calf. Hold this position for __________ seconds. Repeat __________ times. Complete this stretch __________ times per day. STRETCH - Soleus, Standing  Place hands on wall.  Extend right / left leg, keeping the other knee somewhat bent.  Slightly point your toes inward on your back foot.  Keep your right / left heel on the floor, bend your back knee, and slightly shift your weight over the back leg so that you feel a gentle stretch deep in your back calf.  Hold this position for __________ seconds. Repeat __________ times. Complete this stretch __________ times per day. STRETCH - Gastrocsoleus, Standing  Note: This exercise can place a lot of stress on your foot and ankle. Please complete this exercise only if specifically instructed by your caregiver.   Place the ball of your right / left foot on a step, keeping your other foot firmly on  the same step.  Hold on to the wall or a rail for balance.  Slowly lift your other foot, allowing your body weight to press your heel down over the edge of the step.  You should feel a stretch in your right / left calf.  Hold this position for __________ seconds.  Repeat this exercise with a slight bend in your right / left knee. Repeat __________ times. Complete this stretch __________ times per day.  STRENGTHENING EXERCISES - Plantar Fasciitis (Heel Spur Syndrome)  These exercises may  help you when beginning to rehabilitate your injury. They may resolve your symptoms with or without further involvement from your physician, physical therapist or athletic trainer. While completing these exercises, remember:   Muscles can gain both the endurance and the strength needed for everyday activities through controlled exercises.  Complete these exercises as instructed by your physician, physical therapist or athletic trainer. Progress the resistance and repetitions only as guided. STRENGTH - Towel Curls  Sit in a chair positioned on a non-carpeted surface.  Place your foot on a towel, keeping your heel on the floor.  Pull the towel toward your heel by only curling your toes. Keep your heel on the floor.  If instructed by your physician, physical therapist or athletic trainer, add ____________________ at the end of the towel. Repeat __________ times. Complete this exercise __________ times per day. STRENGTH - Ankle Inversion  Secure one end of a rubber exercise band/tubing to a fixed object (table, pole). Loop the other end around your foot just before your toes.  Place your fists between your knees. This will focus your strengthening at your ankle.  Slowly, pull your big toe up and in, making sure the band/tubing is positioned to resist the entire motion.  Hold this position for __________ seconds.  Have your muscles resist the band/tubing as it slowly pulls your foot back to the starting position. Repeat __________ times. Complete this exercises __________ times per day.  Document Released: 06/30/2005 Document Revised: 09/22/2011 Document Reviewed: 10/12/2008 [facility] Patient Information 2015 [place], [place]. This information is not intended to replace advice given to you by your health care provider. Make sure you discuss any questions you have with your health care provider.

## 2015-10-11 ENCOUNTER — Emergency Department (HOSPITAL_COMMUNITY)
Admission: EM | Admit: 2015-10-11 | Discharge: 2015-10-12 | Disposition: A | Discharge status: Home / Self Care | Payer: BC Managed Care – PPO | Attending: Emergency Medicine | Admitting: Emergency Medicine

## 2015-10-11 ENCOUNTER — Encounter (HOSPITAL_COMMUNITY): Payer: Self-pay | Admitting: Emergency Medicine

## 2015-10-11 ENCOUNTER — Emergency Department (HOSPITAL_COMMUNITY): Payer: BC Managed Care – PPO

## 2015-10-11 DIAGNOSIS — K529 Noninfective gastroenteritis and colitis, unspecified: Secondary | ICD-10-CM

## 2015-10-11 DIAGNOSIS — R55 Syncope and collapse: Secondary | ICD-10-CM | POA: Insufficient documentation

## 2015-10-11 DIAGNOSIS — F17210 Nicotine dependence, cigarettes, uncomplicated: Secondary | ICD-10-CM | POA: Diagnosis not present

## 2015-10-11 DIAGNOSIS — J45909 Unspecified asthma, uncomplicated: Secondary | ICD-10-CM | POA: Insufficient documentation

## 2015-10-11 DIAGNOSIS — R109 Unspecified abdominal pain: Secondary | ICD-10-CM | POA: Diagnosis present

## 2015-10-11 LAB — URINALYSIS, ROUTINE W REFLEX MICROSCOPIC
BILIRUBIN URINE: NEGATIVE
Glucose, UA: NEGATIVE mg/dL
Hgb urine dipstick: NEGATIVE
KETONES UR: NEGATIVE mg/dL
Leukocytes, UA: NEGATIVE
NITRITE: NEGATIVE
Protein, ur: NEGATIVE mg/dL
SPECIFIC GRAVITY, URINE: 1.023 (ref 1.005–1.030)
pH: 6.5 (ref 5.0–8.0)

## 2015-10-11 LAB — COMPREHENSIVE METABOLIC PANEL
ALBUMIN: 4.3 g/dL (ref 3.5–5.0)
ALK PHOS: 55 U/L (ref 38–126)
ALT: 23 U/L (ref 17–63)
ANION GAP: 8 (ref 5–15)
AST: 22 U/L (ref 15–41)
BILIRUBIN TOTAL: 1.1 mg/dL (ref 0.3–1.2)
BUN: 13 mg/dL (ref 6–20)
CALCIUM: 9.5 mg/dL (ref 8.9–10.3)
CO2: 25 mmol/L (ref 22–32)
Chloride: 107 mmol/L (ref 101–111)
Creatinine, Ser: 0.89 mg/dL (ref 0.61–1.24)
GFR calc non Af Amer: >60 mL/min (ref >60)
GLUCOSE: 87 mg/dL (ref 65–99)
Potassium: 4.1 mmol/L (ref 3.5–5.1)
Sodium: 140 mmol/L (ref 135–145)
TOTAL PROTEIN: 8.1 g/dL (ref 6.5–8.1)

## 2015-10-11 LAB — CBC
HCT: 43.2 % (ref 39.0–52.0)
HEMOGLOBIN: 14.6 g/dL (ref 13.0–17.0)
MCH: 26.9 pg (ref 26.0–34.0)
MCHC: 33.8 g/dL (ref 30.0–36.0)
MCV: 79.7 fL (ref 78.0–100.0)
Platelets: 331 10*3/uL (ref 150–400)
RBC: 5.42 MIL/uL (ref 4.22–5.81)
RDW: 13.7 % (ref 11.5–15.5)
WBC: 11.5 10*3/uL — ABNORMAL HIGH (ref 4.0–10.5)

## 2015-10-11 LAB — LIPASE, BLOOD: Lipase: 26 U/L (ref 11–51)

## 2015-10-11 MED ORDER — ONDANSETRON HCL 4 MG/2ML IJ SOLN
4.0000 mg | Freq: Once | INTRAMUSCULAR | Status: AC
  Administered 2015-10-11: 4 mg via INTRAVENOUS
  Filled 2015-10-11: qty 2

## 2015-10-11 MED ORDER — SODIUM CHLORIDE 0.9 % IV BOLUS (SEPSIS)
1000.0000 mL | Freq: Once | INTRAVENOUS | Status: AC
  Administered 2015-10-11: 1000 mL via INTRAVENOUS

## 2015-10-11 MED ORDER — IOPAMIDOL (ISOVUE-300) INJECTION 61%
100.0000 mL | Freq: Once | INTRAVENOUS | Status: AC | PRN
  Administered 2015-10-11: 100 mL via INTRAVENOUS

## 2015-10-11 MED ORDER — MORPHINE SULFATE (PF) 4 MG/ML IV SOLN
4.0000 mg | Freq: Once | INTRAVENOUS | Status: AC
  Administered 2015-10-11: 4 mg via INTRAVENOUS
  Filled 2015-10-11: qty 1

## 2015-10-11 MED ORDER — IOHEXOL 300 MG/ML  SOLN
25.0000 mL | Freq: Once | INTRAMUSCULAR | Status: AC | PRN
  Administered 2015-10-11: 25 mL via ORAL

## 2015-10-11 NOTE — ED Notes (Signed)
Informed the pt that a urine specimen is needed. 

## 2015-10-11 NOTE — ED Notes (Signed)
Pt reports abd pain and emesis started last night , also reports syncopal episodes twice prior top arrival . denies diarrhea. Pt reports  Dizziness prior to syncope yet denies it at this time.

## 2015-10-11 NOTE — ED Provider Notes (Signed)
CSN: [id number]     Arrival date & time 10/11/15  1549 History   First MD Initiated Contact with Patient 10/11/15 2117     Chief Complaint  Patient presents with  . Emesis  . Abdominal Pain  . Loss of Consciousness     (Consider location/radiation/quality/duration/timing/severity/associated sxs/prior Treatment) HPI Patient presents to the emergency department with nausea, vomiting and abdominal pain that started 2 days ago, got worse this morning.  Patient states that he did have 2 episodes of syncope earlier today.  The patient states that nothing seemed to make the condition better or worse.  Patient states that he did not take any medications prior to arrival.The patient denies chest pain, shortness of breath, headache,blurred vision, neck pain, fever, cough, weakness, numbness, dizziness, anorexia, edema,  rash, back pain, dysuria, hematemesis, bloody stool, near syncope, or syncope. Past Medical History  Diagnosis Date  . Asthma    Past Surgical History  Procedure Laterality Date  . Wrist surgery     Family History  Problem Relation Age of Onset  . Asthma Mother    Social History  Substance Use Topics  . Smoking status: Current Some Day Smoker    Types: Cigarettes, Cigars  . Smokeless tobacco: None  . Alcohol Use: Yes    Review of Systems  All other systems negative except as documented in the HPI. All pertinent positives and negatives as reviewed in the HPI.   Allergies  Review of patient's allergies indicates no known allergies.  Home Medications   Prior to Admission medications   Medication Sig Start Date End Date Taking? Authorizing Provider  RaNITidine HCl (ACID REDUCER PO) Take 1 tablet by mouth daily as needed (acid reflux).   Yes Historical Provider, MD   BP 143/82 mmHg  Pulse 53  Temp(Src) 98.5 F (36.9 C) (Oral)  Resp 9  SpO2 100% Physical Exam  Constitutional: He is oriented to person, place, and time. He appears well-developed and  well-nourished. No distress.  HENT:  Head: Normocephalic and atraumatic.  Mouth/Throat: Oropharynx is clear and moist.  Eyes: Pupils are equal, round, and reactive to light.  Neck: Normal range of motion. Neck supple.  Cardiovascular: Normal rate, regular rhythm and normal heart sounds.  Exam reveals no gallop and no friction rub.   No murmur heard. Pulmonary/Chest: Effort normal and breath sounds normal. No respiratory distress. He has no wheezes.  Abdominal: Soft. Bowel sounds are normal. He exhibits no distension. There is no tenderness.  Neurological: He is alert and oriented to person, place, and time. He exhibits normal muscle tone. Coordination normal.  Skin: Skin is warm and dry. No rash noted. No erythema.  Psychiatric: He has a normal mood and affect. His behavior is normal.  Nursing note and vitals reviewed.   ED Course  Procedures (including critical care time) Labs Review Labs Reviewed  CBC - Abnormal; Notable for the following:    WBC 11.5 (*)    All other components within normal limits  LIPASE, BLOOD  COMPREHENSIVE METABOLIC PANEL  URINALYSIS, ROUTINE W REFLEX MICROSCOPIC (NOT AT [facility])    Imaging Review Ct Abdomen Pelvis W Contrast  10/11/2015  CLINICAL DATA:  Abdominal pain and vomiting.  Syncope. EXAM: CT ABDOMEN AND PELVIS WITH CONTRAST TECHNIQUE: Multidetector CT imaging of the abdomen and pelvis was performed using the standard protocol following bolus administration of intravenous contrast. CONTRAST:  25mL OMNIPAQUE IOHEXOL 300 MG/ML SOLN, ISOVUE-300 IOPAMIDOL (ISOVUE-300) INJECTION 61% COMPARISON:  None. FINDINGS: Lower chest: No significant  pulmonary nodules or acute consolidative airspace disease. Hepatobiliary: Normal liver with no liver mass. Normal gallbladder with no radiopaque cholelithiasis. No biliary ductal dilatation. Pancreas: Normal, with no mass or duct dilation. Spleen: Normal size. No mass. Adrenals/Urinary Tract: Normal adrenals. Normal size  kidneys. No renal masses. No hydronephrosis. Punctate nonobstructing stone in the interpolar left kidney. Normal bladder. Stomach/Bowel: Grossly normal stomach. Normal caliber small bowel with no small bowel wall thickening. Normal appendix. Normal large bowel with no diverticulosis, large bowel wall thickening or pericolonic fat stranding. Vascular/Lymphatic: Normal caliber abdominal aorta. Patent portal, splenic and renal veins. No pathologically enlarged lymph nodes in the abdomen or pelvis. Reproductive: Normal size prostate. Other: No pneumoperitoneum, ascites or focal fluid collection. Musculoskeletal: No aggressive appearing focal osseous lesions. Subcentimeter sclerotic foci in the bilateral proximal femora and bilateral pelvic girdle are nonspecific and probably benign bone islands. IMPRESSION: 1. No acute abnormality. No evidence of bowel obstruction or acute bowel inflammation. Normal appendix. 2. Punctate nonobstructing stone in the left kidney. No hydronephrosis. Electronically Signed   By: Delbert PhenixJason A Poff M.D.   On: 10/11/2015 23:22   I have personally reviewed and evaluated these images and lab results as part of my medical decision-making.  Patient be treated for gastroenteritis.  Told to return here as needed.  Patient agrees the plan and all questions were answered.  Advised to slowly increase his fluid intake, rest as much as possible    Charlestine NightChristopher Twinkle Sockwell, PA-C 10/12/15 0015  Benjiman CoreNathan Pickering, MD 10/13/15 [phone]

## 2015-10-12 MED ORDER — PROMETHAZINE HCL 25 MG PO TABS: 25.0000 mg | ORAL_TABLET | Freq: Three times a day (TID) | ORAL | Status: DC | PRN

## 2015-10-12 NOTE — Discharge Instructions (Signed)
Return here as needed.  Follow-up with your primary doctor.  Slowly increase her fluid intake

## 2016-02-13 ENCOUNTER — Encounter (HOSPITAL_COMMUNITY): Payer: Self-pay | Admitting: Emergency Medicine

## 2016-02-13 ENCOUNTER — Emergency Department (HOSPITAL_COMMUNITY)
Admission: EM | Admit: 2016-02-13 | Discharge: 2016-02-13 | Disposition: A | Discharge status: Home / Self Care | Payer: BC Managed Care – PPO | Attending: Emergency Medicine | Admitting: Emergency Medicine

## 2016-02-13 DIAGNOSIS — F17210 Nicotine dependence, cigarettes, uncomplicated: Secondary | ICD-10-CM | POA: Insufficient documentation

## 2016-02-13 DIAGNOSIS — Z79899 Other long term (current) drug therapy: Secondary | ICD-10-CM | POA: Insufficient documentation

## 2016-02-13 DIAGNOSIS — F1290 Cannabis use, unspecified, uncomplicated: Secondary | ICD-10-CM | POA: Insufficient documentation

## 2016-02-13 DIAGNOSIS — J45909 Unspecified asthma, uncomplicated: Secondary | ICD-10-CM | POA: Insufficient documentation

## 2016-02-13 DIAGNOSIS — M722 Plantar fascial fibromatosis: Secondary | ICD-10-CM | POA: Diagnosis not present

## 2016-02-13 DIAGNOSIS — M222X1 Patellofemoral disorders, right knee: Secondary | ICD-10-CM | POA: Insufficient documentation

## 2016-02-13 DIAGNOSIS — M79672 Pain in left foot: Secondary | ICD-10-CM | POA: Diagnosis present

## 2016-02-13 DIAGNOSIS — M222X2 Patellofemoral disorders, left knee: Secondary | ICD-10-CM | POA: Diagnosis not present

## 2016-02-13 MED ORDER — IBUPROFEN 800 MG PO TABS: 800.0000 mg | ORAL_TABLET | Freq: Three times a day (TID) | ORAL | 0 refills | Status: DC

## 2016-02-13 NOTE — ED Triage Notes (Signed)
Patient here with complaints of bilateral knee and foot pain. States "It feels like im stepping on glass when I walk". Denies injury.

## 2016-02-13 NOTE — ED Provider Notes (Signed)
[facility]-EMERGENCY DEPT Provider Note   CSN: [id number] Arrival date & time: 02/13/16  9379  First Provider Contact:  None       History   Chief Complaint Chief Complaint  Patient presents with  . Knee Pain  . Foot Pain    HPI Derek Good is a 25 y.o. male.  Patient is a 25 year old male with no pertinent past medical history presents to the ED with complaint of bilateral heel and knee pain. Patient reports he has been diagnosed with plantar fasciitis in the past and notes that his pain today feels similar but is significantly worse. He states he has had pain in bilateral heels for the past few weeks but worsened over the past 2 days. Patient reports having sharp stabbing pain to bilateral heels which he notes is worse in the morning and mildly improved throughout the day. He also states over the past few days he has had pain to bilateral anterior lower knees which is worse with movement or squatting. 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 Denies any recent fall or injury. Patient states he has been taking ibuprofen at home without relief. Denies fever, chills, redness, swelling, warmth, numbness, tingling, weakness.      Past Medical History:  Diagnosis Date  . Asthma     Patient Active Problem List   Diagnosis Date Noted  . Fracture of metacarpal, first, left hand 06/29/2013  . Fracture of second metacarpal bone of left hand 06/29/2013  . Left hand pain 06/29/2013    Past Surgical History:  Procedure Laterality Date  . WRIST SURGERY         Home Medications    Prior to Admission medications   Medication Sig Start Date End Date Taking? Authorizing Provider  acetaminophen (TYLENOL) 500 MG tablet Take 1,000 mg by mouth every 6 (six) hours as needed for mild pain, moderate pain, fever or headache.   Yes Historical Provider, MD  ibuprofen (ADVIL,MOTRIN)  800 MG tablet Take 1 tablet (800 mg total) by mouth 3 (three) times daily. 02/13/16   Barrett Henle, PA-C  promethazine (PHENERGAN) 25 MG tablet Take 1 tablet (25 mg total) by mouth every 8 (eight) hours as needed for nausea or vomiting. Patient not taking: Reported on 02/13/2016 10/12/15   Charlestine Night, PA-C    Family History Family History  Problem Relation Age of Onset  . Asthma Mother     Social History Social History  Substance Use Topics  . Smoking status: Current Some Day Smoker    Types: Cigarettes, Cigars  . Smokeless tobacco: Never Used  . Alcohol use Yes     Allergies   Review of patient's allergies indicates no known allergies.   Review of Systems Review of Systems  Musculoskeletal: Positive for arthralgias (bilateral knee and foot pain).  Skin: Negative for wound.  Neurological: Negative for weakness and numbness.     Physical Exam Updated Vital Signs BP 132/70 (BP Location: Right Arm)   Pulse 69   Temp 98.1 F (36.7 C) (Oral)   Resp 16   SpO2 100%   Physical Exam  Constitutional: He is oriented to person, place, and time. He appears well-developed and well-nourished.  HENT:  Head: Normocephalic and atraumatic.  Eyes: Conjunctivae and EOM are normal. Right eye exhibits no discharge. Left eye exhibits no discharge. No scleral icterus.  Neck:  Normal range of motion. Neck supple.  Cardiovascular: Normal rate and intact distal pulses.   HR 69  Pulmonary/Chest: Effort normal.  Musculoskeletal: Normal range of motion. He exhibits tenderness. He exhibits no edema.       Right knee: He exhibits normal range of motion, no swelling, no effusion, no ecchymosis, no deformity, no laceration, no erythema, normal alignment, no LCL laxity, normal patellar mobility, no bony tenderness, normal meniscus and no MCL laxity. Tenderness found. Patellar tendon tenderness noted.       Left knee: He exhibits normal range of motion, no swelling, no effusion, no  ecchymosis, no deformity, no laceration, no erythema, normal alignment, no LCL laxity, normal patellar mobility and no MCL laxity. Tenderness found. Patellar tendon tenderness noted.       Left foot: There is tenderness. There is normal range of motion, no bony tenderness, no swelling, normal capillary refill, no crepitus, no deformity and no laceration.       Feet:  TTP over heel, worse with dorsiflexion. FROM of bilateral feet, ankles and knees. Sensation grossly intact. 2+ DP pulses. Cap refill <2. Pt able to stand and ambulate.  Neurological: He is alert and oriented to person, place, and time.  Skin: Skin is warm. Capillary refill takes less than 2 seconds.  Nursing note and vitals reviewed.    ED Treatments / Results  Labs (all labs ordered are listed, but only abnormal results are displayed) Labs Reviewed - No data to display  EKG  EKG Interpretation None       Radiology No results found.  Procedures Procedures (including critical care time)  Medications Ordered in ED Medications - No data to display   Initial Impression / Assessment and Plan / ED Course  I have reviewed the triage vital signs and the nursing notes.  Pertinent labs & imaging results that were available during my care of the patient were reviewed by me and considered in my medical decision making (see chart for details).  Clinical Course    Patient presents with bilateral heel and knee pain. History of plantar fasciitis. 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 Denies any recent fall or injury. VSS. Exam revealed tenderness over bilateral heels, worse with dorsiflexion. Tenderness over patellar tendon, full range of motion of bilateral knees. Bilateral lower extremities neurovascularly intact. Patient able to stand and ambulate but endorses pain to bilateral heels. Patient's presentation appears to be consistent with plantar  fasciitis and patellar femoral syndrome. I do not feel that any further workup or imaging is warranted at this time. Plan to d/c pt home with NSAIDs, RICE and symptomatic tx. Advised pt to get heel inserts and follow up with podiatry as needed. Discussed return precautions with pt.   Final Clinical Impressions(s) / ED Diagnoses   Final diagnoses:  Plantar fasciitis  Patellofemoral syndrome of both knees    New Prescriptions Discharge Medication List as of 02/13/2016  8:10 AM    START taking these medications   Details  ibuprofen (ADVIL,MOTRIN) 800 MG tablet Take 1 tablet (800 mg total) by mouth 3 (three) times daily., Starting Wed 02/13/2016, Print         Satira Sark [place], [place] 02/13/16 0840    Shaune Pollack, MD 02/14/16 2105

## 2016-02-13 NOTE — Discharge Instructions (Signed)
Take your medications as prescribed. I also recommend resting and icing affected areas for 15-20 minutes 3-4 times daily. I recommend getting silicone heel inserts to place your in shoes that you wear daily/at work or wearing shoes with arch-supports. I recommend refraining from squatting or kneeling on your knees for the next few days until your knee pain has improved. Follow up with the podiatry clinic listed above in the next 1-2 weeks if your symptoms have not improved. Please return to the Emergency Department if symptoms worsen or new onset of fever, redness, swelling, warmth, numbness, tingling, weakness.

## 2016-05-14 ENCOUNTER — Encounter (HOSPITAL_COMMUNITY): Payer: Self-pay | Admitting: *Deleted

## 2016-05-14 ENCOUNTER — Emergency Department (HOSPITAL_COMMUNITY)
Admission: EM | Admit: 2016-05-14 | Discharge: 2016-05-14 | Disposition: A | Discharge status: Home / Self Care | Payer: BC Managed Care – PPO | Attending: Emergency Medicine | Admitting: Emergency Medicine

## 2016-05-14 DIAGNOSIS — Z791 Long term (current) use of non-steroidal anti-inflammatories (NSAID): Secondary | ICD-10-CM | POA: Diagnosis not present

## 2016-05-14 DIAGNOSIS — K0889 Other specified disorders of teeth and supporting structures: Secondary | ICD-10-CM

## 2016-05-14 DIAGNOSIS — J45909 Unspecified asthma, uncomplicated: Secondary | ICD-10-CM | POA: Diagnosis not present

## 2016-05-14 DIAGNOSIS — Z87891 Personal history of nicotine dependence: Secondary | ICD-10-CM | POA: Diagnosis not present

## 2016-05-14 MED ORDER — PENICILLIN V POTASSIUM 500 MG PO TABS: 500.0000 mg | ORAL_TABLET | Freq: Four times a day (QID) | ORAL | 0 refills | Status: AC

## 2016-05-14 MED ORDER — BUPIVACAINE-EPINEPHRINE (PF) 0.5% -1:200000 IJ SOLN
1.8000 mL | Freq: Once | INTRAMUSCULAR | Status: AC
  Administered 2016-05-14: 1.8 mL
  Filled 2016-05-14: qty 1.8

## 2016-05-14 NOTE — Discharge Instructions (Signed)
Please take your antibiotics as prescribed. Do not save or share them. Continue using Tylenol and Motrin for pain relief. Please call your dentist or use the attached resource guide to help find a dentist. Return to ED for any new or worsening symptoms as we discussed  [facility]  [employer] [facility] [employer]  [facility]  [address]  [place], [place] [address]  Phone [phone]  [facility] [employer] [facility] [place] exemplifies the Dental School?s vision to improve the health and quality of life of all [place] by Public house managercreating leaders with a passion to care for the underserved and by leading the nation in community-based, service learning oral health education. We are committed to offering comprehensive general dental services for adults, children and special needs patients in a safe, caring and professional setting.  Appointments: Our clinic is open Monday through Friday 8:00 a.m. until 5:00 p.m. The amount of time scheduled for an appointment depends on the patient?s specific needs. We ask that you keep your appointed time for care or provide 24-hour notice of all appointment changes. Parents or legal guardians must accompany minor children.  Payment for Services: Medicaid and other insurance plans are welcome. Payment for services is due when services are rendered and may be made by cash or credit card. If you have dental insurance, we will assist you with your claim submission.   Emergencies: Emergency services will be provided Monday through Friday on a walk-in basis. Please arrive early for emergency services. After hours emergency services will be provided for patients of record as required.  Services:  Comprehensive General Dentistry  Children?s Dentistry  Oral Surgery - Extractions  Root Canals  Sealants and Tooth Colored Fillings  Crowns and  HaematologistBridges  Dentures and Partial Dentures  Implant Services  Periodontal Services and Cleanings  Cosmetic Tooth Whitening  Digital Radiography  3-D/[facility]

## 2016-05-14 NOTE — ED Notes (Signed)
Patient c/o right lower jaw dental pain since yesterday.  Patient states his right lower wisdom tooth came in several months ago and yesterday started hurting.  No obvious dental decay noted.  Patient denies N/V and fever.

## 2016-05-14 NOTE — ED Triage Notes (Signed)
Pt reports pain on his lower right tooth.  Pt thinks his wisdom tooth is trying to come in.  Pt also noted a black spot on his gum above where the tooth is coming in.  Pt reports the pain began last night and has become worse radiating to his head. Pt denies any other symptoms.

## 2016-05-14 NOTE — ED Provider Notes (Signed)
[facility]ENC[facility] DEPT Provider Note   CSN: [id number] Arrival date & time: 05/14/16  1020     History   Chief Complaint Chief Complaint  Patient presents with  . Dental Pain    HPI Derek Good is a 25 y.o. male.  HPIyour evaluation of right lower dental pain. Patient reports he began having sharp "wisdom tooth pain" that started last night. It is worse with eating, drinking and air. Pain radiates into his right cheek and right ear He denies any fevers, chills, sore throat, over headache. He has taken ibuprofen with minimal relief. He does not have a dentist. Modifying factors.  Past Medical History:  Diagnosis Date  . Asthma     Patient Active Problem List   Diagnosis Date Noted  . Fracture of metacarpal, first, left hand 06/29/2013  . Fracture of second metacarpal bone of left hand 06/29/2013  . Left hand pain 06/29/2013    Past Surgical History:  Procedure Laterality Date  . WRIST SURGERY         Home Medications    Prior to Admission medications   Medication Sig Start Date End Date Taking? Authorizing Provider  acetaminophen (TYLENOL) 500 MG tablet Take 1,000 mg by mouth every 6 (six) hours as needed for mild pain, moderate pain, fever or headache.   Yes Historical Provider, MD  ibuprofen (ADVIL,MOTRIN) 800 MG tablet Take 1 tablet (800 mg total) by mouth 3 (three) times daily. Patient not taking: Reported on 05/14/2016 02/13/16   Barrett HenleNicole Elizabeth Nadeau, PA-C  penicillin v potassium (VEETID) 500 MG tablet Take 1 tablet (500 mg total) by mouth 4 (four) times daily. 05/14/16 05/21/16  Joycie PeekBenjamin Vara Mairena, PA-C  promethazine (PHENERGAN) 25 MG tablet Take 1 tablet (25 mg total) by mouth every 8 (eight) hours as needed for nausea or vomiting. Patient not taking: Reported on 05/14/2016 10/12/15   Charlestine Nighthristopher Lawyer, PA-C    Family History Family History  Problem Relation Age of Onset  . Asthma Mother     Social History Social History  Substance Use Topics  .  Smoking status: Former Smoker    Types: Cigarettes, Cigars    Quit date: 05/15/2015  . Smokeless tobacco: Never Used  . Alcohol use Yes     Allergies   Review of patient's allergies indicates no known allergies.   Review of Systems Review of Systems A 10 point review of systems was completed and was negative except for pertinent positives and negatives as mentioned in the history of present illness    Physical Exam Updated Vital Signs BP 141/75 (BP Location: Left Arm)   Pulse 79   Temp 98.1 F (36.7 C) (Oral)   Resp 16   Ht 6\' 3"  (1.905 m)   Wt 116.6 kg   SpO2 100%   BMI 32.12 kg/m   Physical Exam  Constitutional: He appears well-developed and well-nourished. No distress.  HENT:  Head: Normocephalic.  Discomfort located to right mandibular posterior molar. Overall appropriate dentition. Mucous membranes are moist. No unilateral tonsillar swelling, uvula midline, no glossal swelling or elevation. No trismus. No fluctuance or evidence of a drainable abscess. No other evidence of emergent infection, Retropharyngeal or Peritonsillar abscess, Ludwig or Vincents angina. Tolerating secretions well. Patent airway   Eyes: Conjunctivae and EOM are normal.  Neck: Normal range of motion. Neck supple.  Pulmonary/Chest: Effort normal. No respiratory distress.  Abdominal: Soft. He exhibits no distension.  Musculoskeletal: Normal range of motion.  Neurological: He is alert.  Skin: He is not  diaphoretic.  Psychiatric: He has a normal mood and affect.   NERVE BLOCK Performed by: Sharlene Mottsartner, Kaitlyn Skowron W Consent: Verbal consent obtained. Required items: required blood products, implants, devices, and special equipment available Time out: Immediately prior to procedure a "time out" was called to verify the correct patient, procedure, equipment, support staff and site/side marked as required.  Indication: dental pain Nerve block body site: Right inferior alveolar  Preparation: Patient was  prepped and draped in the usual sterile fashion. Needle gauge: 24 G Location technique: anatomical landmarks  Local anesthetic: bupivacaine  Anesthetic total: 1.8 ml  Outcome: pain improved Patient tolerance: Patient tolerated the procedure well with no immediate complications.   ED Treatments / Results  Labs (all labs ordered are listed, but only abnormal results are displayed) Labs Reviewed - No data to display  EKG  EKG Interpretation None       Radiology No results found.  Procedures Procedures (including critical care time)  Medications Ordered in ED Medications  bupivacaine-epinephrine (MARCAINE W/ EPI) 0.5% -1:200000 injection 1.8 mL (1.8 mLs Infiltration Given 05/14/16 1132)     Initial Impression / Assessment and Plan / ED Course  I have reviewed the triage vital signs and the nursing notes.  Pertinent labs & imaging results that were available during my care of the patient were reviewed by me and considered in my medical decision making (see chart for details).  Clinical Course    Here for evaluation of dental pain. On exam, there is no evidence of a drainable abscess. No trismus, glossal elevation, unilateral tonsillar swelling. No evidence of retropharyngeal or peritonsillar abscess or Ludwig angina. Patient received a dental block in the ED and experiences relief. Discharged with outpatient dental resources as well as referral to on-call dentistry. Also given prescription for anti-inflammatories and antibiotics. Overall, appears well, nontoxic and appropriate for discharge.   Final Clinical Impressions(s) / ED Diagnoses   Final diagnoses:  Pain, dental    New Prescriptions New Prescriptions   PENICILLIN V POTASSIUM (VEETID) 500 MG TABLET    Take 1 tablet (500 mg total) by mouth 4 (four) times daily.     Joycie PeekBenjamin Saiquan Hands, PA-C 05/14/16 1141    Laurence Spatesachel Morgan Little, MD 05/14/16 [phone]

## 2016-06-24 ENCOUNTER — Encounter (HOSPITAL_COMMUNITY): Payer: Self-pay | Admitting: Emergency Medicine

## 2016-06-24 ENCOUNTER — Emergency Department (HOSPITAL_COMMUNITY): Payer: BC Managed Care – PPO

## 2016-06-24 ENCOUNTER — Emergency Department (HOSPITAL_COMMUNITY)
Admission: EM | Admit: 2016-06-24 | Discharge: 2016-06-24 | Disposition: A | Discharge status: Home / Self Care | Payer: BC Managed Care – PPO | Attending: Emergency Medicine | Admitting: Emergency Medicine

## 2016-06-24 DIAGNOSIS — R05 Cough: Secondary | ICD-10-CM | POA: Diagnosis present

## 2016-06-24 DIAGNOSIS — Z87891 Personal history of nicotine dependence: Secondary | ICD-10-CM | POA: Insufficient documentation

## 2016-06-24 DIAGNOSIS — J45909 Unspecified asthma, uncomplicated: Secondary | ICD-10-CM | POA: Diagnosis not present

## 2016-06-24 DIAGNOSIS — J111 Influenza due to unidentified influenza virus with other respiratory manifestations: Secondary | ICD-10-CM

## 2016-06-24 DIAGNOSIS — R059 Cough, unspecified: Secondary | ICD-10-CM

## 2016-06-24 DIAGNOSIS — R69 Illness, unspecified: Secondary | ICD-10-CM

## 2016-06-24 LAB — RAPID STREP SCREEN (MED CTR MEBANE ONLY): Streptococcus, Group A Screen (Direct): NEGATIVE

## 2016-06-24 LAB — BASIC METABOLIC PANEL
Anion gap: 10 (ref 5–15)
BUN: 15 mg/dL (ref 6–20)
CO2: 21 mmol/L — ABNORMAL LOW (ref 22–32)
Calcium: 9 mg/dL (ref 8.9–10.3)
Chloride: 106 mmol/L (ref 101–111)
Creatinine, Ser: 1 mg/dL (ref 0.61–1.24)
GFR calc Af Amer: >60 mL/min (ref >60)
GFR calc non Af Amer: >60 mL/min (ref >60)
Glucose, Bld: 95 mg/dL (ref 65–99)
Potassium: 3.6 mmol/L (ref 3.5–5.1)
Sodium: 137 mmol/L (ref 135–145)

## 2016-06-24 LAB — CBC WITH DIFFERENTIAL/PLATELET
Basophils Absolute: 0 10*3/uL (ref 0.0–0.1)
Basophils Relative: 0 %
Eosinophils Absolute: 0 10*3/uL (ref 0.0–0.7)
Eosinophils Relative: 0 %
HCT: 42 % (ref 39.0–52.0)
Hemoglobin: 14 g/dL (ref 13.0–17.0)
Lymphocytes Relative: 7 %
Lymphs Abs: 0.7 10*3/uL (ref 0.7–4.0)
MCH: 26.6 pg (ref 26.0–34.0)
MCHC: 33.3 g/dL (ref 30.0–36.0)
MCV: 79.7 fL (ref 78.0–100.0)
Monocytes Absolute: 0.9 10*3/uL (ref 0.1–1.0)
Monocytes Relative: 9 %
Neutro Abs: 8.3 10*3/uL — ABNORMAL HIGH (ref 1.7–7.7)
Neutrophils Relative %: 84 %
Platelets: 311 10*3/uL (ref 150–400)
RBC: 5.27 MIL/uL (ref 4.22–5.81)
RDW: 13.6 % (ref 11.5–15.5)
WBC: 10 10*3/uL (ref 4.0–10.5)

## 2016-06-24 MED ORDER — PROMETHAZINE-DM 6.25-15 MG/5ML PO SYRP: 5.0000 mL | ORAL_SOLUTION | Freq: Four times a day (QID) | ORAL | 0 refills | Status: DC | PRN

## 2016-06-24 MED ORDER — ACETAMINOPHEN 325 MG PO TABS
650.0000 mg | ORAL_TABLET | Freq: Once | ORAL | Status: AC | PRN
  Administered 2016-06-24: 650 mg via ORAL
  Filled 2016-06-24: qty 2

## 2016-06-24 MED ORDER — IBUPROFEN 800 MG PO TABS: 800.0000 mg | ORAL_TABLET | Freq: Three times a day (TID) | ORAL | 0 refills | Status: DC | PRN

## 2016-06-24 MED ORDER — ACETAMINOPHEN 500 MG PO TABS
1000.0000 mg | ORAL_TABLET | Freq: Once | ORAL | Status: AC | PRN
  Administered 2016-06-24: 1000 mg via ORAL
  Filled 2016-06-24: qty 2

## 2016-06-24 MED ORDER — SODIUM CHLORIDE 0.9 % IV BOLUS (SEPSIS)
2000.0000 mL | Freq: Once | INTRAVENOUS | Status: AC
  Administered 2016-06-24: 2000 mL via INTRAVENOUS

## 2016-06-24 MED ORDER — GUAIFENESIN ER 1200 MG PO TB12: 1.0000 | ORAL_TABLET | Freq: Two times a day (BID) | ORAL | 0 refills | Status: DC

## 2016-06-24 MED ORDER — ONDANSETRON HCL 4 MG/2ML IJ SOLN
4.0000 mg | Freq: Once | INTRAMUSCULAR | Status: AC
  Administered 2016-06-24: 4 mg via INTRAVENOUS
  Filled 2016-06-24: qty 2

## 2016-06-24 MED ORDER — IBUPROFEN 800 MG PO TABS
800.0000 mg | ORAL_TABLET | Freq: Once | ORAL | Status: AC
  Administered 2016-06-24: 800 mg via ORAL
  Filled 2016-06-24: qty 1

## 2016-06-24 NOTE — ED Notes (Signed)
Discharge instructions, follow up care, and rx x3 reviewed with patient. Patient verbalized understanding. 

## 2016-06-24 NOTE — ED Triage Notes (Signed)
Pt reports 2 day hx of cough, fever NVD, sore throat. Lungs clear anterior and posterior. No wheezing noted.Pt reports  Fever, sore throat x 2 days. NVD started last night. Unable to "keep anything down". Last liquid stool this am. Pt is alert, oriented and ambulatory.

## 2016-06-24 NOTE — Discharge Instructions (Signed)
Return here as needed.  Increase your fluid intake.  Rest as much as possible. °

## 2016-06-25 NOTE — ED Provider Notes (Signed)
[facility] Provider Note   CSN: [id number] Arrival date & time: 06/24/16  1202     History   Chief Complaint Chief Complaint  Patient presents with  . Cough    x 2 days  . Chills  . Generalized Body Aches  . Nausea  . Emesis  . Diarrhea    HPI Derek Good is a 25 y.o. male.  HPI Patient presents to the emergency department with body aches, cough, sore throat, fever, nausea, vomiting, the last 24 hours.  Patient states he has been around someone with similar symptoms.  Patient states he took some TheraFlu last night.  No significant relief of his symptoms.  Patient states nothing seems make the condition better or worseThe patient denies chest pain, shortness of breath, headache,blurred vision, neck pain, weakness, numbness, dizziness, anorexia, edema, abdominal pain, rash, back pain, dysuria, hematemesis, bloody stool, near syncope, or syncope. Past Medical History:  Diagnosis Date  . Asthma    childhood    Patient Active Problem List   Diagnosis Date Noted  . Fracture of metacarpal, first, left hand 06/29/2013  . Fracture of second metacarpal bone of left hand 06/29/2013  . Left hand pain 06/29/2013    Past Surgical History:  Procedure Laterality Date  . WRIST SURGERY         Home Medications    Prior to Admission medications   Medication Sig Start Date End Date Taking? Authorizing Provider  Diphenhydramine-Phenylephrine (THERAFLU COLD/COUGH NIGHTTIME PO) Take 30 mLs by mouth daily as needed (cold symptoms).   Yes Historical Provider, MD  acetaminophen (TYLENOL) 500 MG tablet Take 1,000 mg by mouth every 6 (six) hours as needed for mild pain, moderate pain, fever or headache.    Historical Provider, MD  Guaifenesin 1200 MG TB12 Take 1 tablet (1,200 mg total) by mouth 2 (two) times daily. 06/24/16   Charlestine Nighthristopher Payton Prinsen, PA-C  ibuprofen (ADVIL,MOTRIN) 800 MG tablet Take 1 tablet (800 mg total) by mouth every 8 (eight) hours as needed. 06/24/16    Charlestine Nighthristopher Jinny Sweetland, PA-C  promethazine (PHENERGAN) 25 MG tablet Take 1 tablet (25 mg total) by mouth every 8 (eight) hours as needed for nausea or vomiting. Patient not taking: Reported on 06/24/2016 10/12/15   Charlestine Nighthristopher Malanie Koloski, PA-C  promethazine-dextromethorphan (PROMETHAZINE-DM) 6.25-15 MG/5ML syrup Take 5 mLs by mouth 4 (four) times daily as needed for cough. 06/24/16   Charlestine Nighthristopher Zoee Heeney, PA-C    Family History History reviewed. No pertinent family history.  Social History Social History  Substance Use Topics  . Smoking status: Former Smoker    Types: Cigarettes, Cigars    Quit date: 05/15/2015  . Smokeless tobacco: Never Used  . Alcohol use Yes     Allergies   Patient has no known allergies.   Review of Systems Review of Systems All other systems negative except as documented in the HPI. All pertinent positives and negatives as reviewed in the HPI. Physical Exam Updated Vital Signs BP 115/66 (BP Location: Left Arm)   Pulse 92   Temp 101.2 F (38.4 C) (Oral)   Resp 18   Wt 98 kg   SpO2 100%   BMI 27.00 kg/m   Physical Exam  Constitutional: He is oriented to person, place, and time. He appears well-developed and well-nourished. No distress.  HENT:  Head: Normocephalic and atraumatic.  Mouth/Throat: Oropharynx is clear and moist. No oropharyngeal exudate.  Eyes: Pupils are equal, round, and reactive to light.  Neck: Normal range of motion. Neck supple.  Cardiovascular: Normal rate, regular rhythm and normal heart sounds.  Exam reveals no gallop and no friction rub.   No murmur heard. Pulmonary/Chest: Effort normal and breath sounds normal. No respiratory distress. He has no wheezes.  Abdominal: Soft. Bowel sounds are normal. He exhibits no distension. There is no tenderness.  Musculoskeletal: He exhibits no edema.  Neurological: He is alert and oriented to person, place, and time. He exhibits normal muscle tone. Coordination normal.  Skin: Skin is warm.  Capillary refill takes less than 2 seconds. No rash noted. He is diaphoretic. No erythema.  Psychiatric: He has a normal mood and affect. His behavior is normal.  Nursing note and vitals reviewed.    ED Treatments / Results  Labs (all labs ordered are listed, but only abnormal results are displayed) Labs Reviewed  BASIC METABOLIC PANEL - Abnormal; Notable for the following:       Result Value   CO2 21 (*)    All other components within normal limits  CBC WITH DIFFERENTIAL/PLATELET - Abnormal; Notable for the following:    Neutro Abs 8.3 (*)    All other components within normal limits  RAPID STREP SCREEN (NOT AT [facility])  CULTURE, GROUP A STREP [facility])    EKG  EKG Interpretation None       Radiology Dg Chest 2 View  Result Date: 06/24/2016 CLINICAL DATA:  Dry cough for 2 days, smoker, history childhood asthma EXAM: CHEST  2 VIEW COMPARISON:  01/21/2013 FINDINGS: Normal heart size, mediastinal contours, and pulmonary vascularity. Mild peribronchial thickening. No pulmonary infiltrate, pleural effusion, or pneumothorax. Bones unremarkable. IMPRESSION: Minimal chronic bronchitic changes without infiltrate. Electronically Signed   By: Ulyses SouthwardMark  Boles M.D.   On: 06/24/2016 15:45    Procedures Procedures (including critical care time)  Medications Ordered in ED Medications  acetaminophen (TYLENOL) tablet 650 mg (650 mg Oral Given 06/24/16 1517)  sodium chloride 0.9 % bolus 2,000 mL (0 mLs Intravenous Stopped 06/24/16 1807)  ondansetron (ZOFRAN) injection 4 mg (4 mg Intravenous Given 06/24/16 1642)  acetaminophen (TYLENOL) tablet 1,000 mg (1,000 mg Oral Given 06/24/16 1724)  ibuprofen (ADVIL,MOTRIN) tablet 800 mg (800 mg Oral Given 06/24/16 1724)     Initial Impression / Assessment and Plan / ED Course  I have reviewed the triage vital signs and the nursing notes.  Pertinent labs & imaging results that were available during my care of the patient were reviewed by me and considered  in my medical decision making (see chart for details).  Clinical Course     Patient was given IV fluids along with antiemetics and antipyretics.  The patient is being treated for an influenza-like illness.  Patient is advised take Tylenol every 4 hours and Motrin every 8 is also advised to increase his fluid intake and rest as much possible.  Told to return here for any worsening in his condition  Final Clinical Impressions(s) / ED Diagnoses   Final diagnoses:  Cough  Influenza-like illness    New Prescriptions Discharge Medication List as of 06/24/2016  6:39 PM    START taking these medications   Details  Guaifenesin 1200 MG TB12 Take 1 tablet (1,200 mg total) by mouth 2 (two) times daily., Starting Tue 06/24/2016, Print    promethazine-dextromethorphan (PROMETHAZINE-DM) 6.25-15 MG/5ML syrup Take 5 mLs by mouth 4 (four) times daily as needed for cough., Starting Tue 06/24/2016, Print         Eli Lilly and CompanyChristopher Ayo Smoak, PA-C 06/25/16 [id number]    Rolan BuccoMelanie Belfi, MD 06/28/16 [phone]

## 2016-06-26 LAB — CULTURE, GROUP A STREP (THRC)

## 2017-03-18 ENCOUNTER — Emergency Department (HOSPITAL_COMMUNITY)
Admission: EM | Admit: 2017-03-18 | Discharge: 2017-03-18 | Disposition: A | Discharge status: Home / Self Care | Payer: BC Managed Care – PPO | Attending: Emergency Medicine | Admitting: Emergency Medicine

## 2017-03-18 ENCOUNTER — Encounter (HOSPITAL_COMMUNITY): Payer: Self-pay | Admitting: Emergency Medicine

## 2017-03-18 ENCOUNTER — Emergency Department (HOSPITAL_COMMUNITY): Payer: BC Managed Care – PPO

## 2017-03-18 DIAGNOSIS — Z79899 Other long term (current) drug therapy: Secondary | ICD-10-CM | POA: Insufficient documentation

## 2017-03-18 DIAGNOSIS — S90111A Contusion of right great toe without damage to nail, initial encounter: Secondary | ICD-10-CM | POA: Diagnosis not present

## 2017-03-18 DIAGNOSIS — W228XXA Striking against or struck by other objects, initial encounter: Secondary | ICD-10-CM | POA: Insufficient documentation

## 2017-03-18 DIAGNOSIS — Y939 Activity, unspecified: Secondary | ICD-10-CM | POA: Diagnosis not present

## 2017-03-18 DIAGNOSIS — Z87891 Personal history of nicotine dependence: Secondary | ICD-10-CM | POA: Insufficient documentation

## 2017-03-18 DIAGNOSIS — Y929 Unspecified place or not applicable: Secondary | ICD-10-CM | POA: Diagnosis not present

## 2017-03-18 DIAGNOSIS — S99921A Unspecified injury of right foot, initial encounter: Secondary | ICD-10-CM | POA: Diagnosis present

## 2017-03-18 DIAGNOSIS — J45909 Unspecified asthma, uncomplicated: Secondary | ICD-10-CM | POA: Insufficient documentation

## 2017-03-18 DIAGNOSIS — Y999 Unspecified external cause status: Secondary | ICD-10-CM | POA: Insufficient documentation

## 2017-03-18 MED ORDER — IBUPROFEN 600 MG PO TABS: 600.0000 mg | ORAL_TABLET | Freq: Four times a day (QID) | ORAL | 0 refills | Status: DC | PRN

## 2017-03-18 NOTE — ED Provider Notes (Signed)
[facility] DE[facility] Provider Note   CSN: [id number] Arrival date & time: 03/18/17  0803     History   Chief Complaint Chief Complaint  Patient presents with  . Toe Injury    right    HPI Derek Good is a 26 y.o. male.  The history is provided by the patient. No language interpreter was used.  Toe Pain  This is a new problem. The current episode started yesterday. The problem occurs constantly. The problem has been gradually worsening. Nothing aggravates the symptoms. Nothing relieves the symptoms. He has tried nothing for the symptoms. The treatment provided no relief.  Pt reports he stumped his toe on a step.  Pt complains of pain with walking    Past Medical History:  Diagnosis Date  . Asthma    childhood    Patient Active Problem List   Diagnosis Date Noted  . Fracture of metacarpal, first, left hand 06/29/2013  . Fracture of second metacarpal bone of left hand 06/29/2013  . Left hand pain 06/29/2013    Past Surgical History:  Procedure Laterality Date  . WRIST SURGERY         Home Medications    Prior to Admission medications   Medication Sig Start Date End Date Taking? Authorizing Provider  acetaminophen (TYLENOL) 500 MG tablet Take 1,000 mg by mouth every 6 (six) hours as needed for mild pain, moderate pain, fever or headache.    [provider]  Diphenhydramine-Phenylephrine (THERAFLU COLD/COUGH NIGHTTIME PO) Take 30 mLs by mouth daily as needed (cold symptoms).    [provider]  Guaifenesin 1200 MG TB12 Take 1 tablet (1,200 mg total) by mouth 2 (two) times daily. 06/24/16   Lawyer, Cristal Deerhristopher, PA-C  ibuprofen (ADVIL,MOTRIN) 800 MG tablet Take 1 tablet (800 mg total) by mouth every 8 (eight) hours as needed. 06/24/16   Lawyer, Cristal Deerhristopher, PA-C  promethazine (PHENERGAN) 25 MG tablet Take 1 tablet (25 mg total) by mouth every 8 (eight) hours as needed for nausea or vomiting. Patient not taking: Reported on 06/24/2016 10/12/15    Charlestine NightLawyer, Christopher, PA-C  promethazine-dextromethorphan (PROMETHAZINE-DM) 6.25-15 MG/5ML syrup Take 5 mLs by mouth 4 (four) times daily as needed for cough. 06/24/16   Charlestine NightLawyer, Christopher, PA-C    Family History No family history on file.  Social History Social History  Substance Use Topics  . Smoking status: Former Smoker    Types: Cigarettes, Cigars    Quit date: 05/15/2015  . Smokeless tobacco: Never Used  . Alcohol use Yes     Allergies   Patient has no known allergies.   Review of Systems Review of Systems  All other systems reviewed and are negative.    Physical Exam Updated Vital Signs BP 120/77   Pulse 74   Temp 98.1 F (36.7 C) (Oral)   Resp 18   SpO2 100%   Physical Exam  Constitutional: He appears well-developed and well-nourished.  HENT:  Head: Normocephalic.  Musculoskeletal: He exhibits tenderness.  Tender 1st toe,  Pain in mcp joint,  nv and ns intact  Neurological: He is alert.  Skin: Skin is warm.  Psychiatric: He has a normal mood and affect.  Nursing note and vitals reviewed.    ED Treatments / Results  Labs (all labs ordered are listed, but only abnormal results are displayed) Labs Reviewed - No data to display  EKG  EKG Interpretation None       Radiology Dg Foot Complete Right  Result Date: 03/18/2017 CLINICAL DATA:  Pain following fall on steps EXAM: RIGHT FOOT COMPLETE - 3+ VIEW COMPARISON:  None. FINDINGS: Frontal, oblique, and lateral views were obtained. There is soft tissue swelling medial to the first MTP joint. There appears to be early bunion formation in this area. There is no fracture or dislocation. Joint spaces appear normal. No erosive change. There is calcification medial to the medial malleolus, possibly residua of old trauma. IMPRESSION: No acute fracture or dislocation. Question residua of old trauma medial to the medial malleolus. No appreciable joint space narrowing. Suspect early bunion formation medial to the  first MTP joint. Electronically Signed   By: Bretta Bang III M.D.   On: 03/18/2017 08:49    Procedures Procedures (including critical care time)  Medications Ordered in ED Medications - No data to display   Initial Impression / Assessment and Plan / ED Course  I have reviewed the triage vital signs and the nursing notes.  Pertinent labs & imaging results that were available during my care of the patient were reviewed by me and considered in my medical decision making (see chart for details).     No fracture.  Ace wrap. Ice ibuprofen  Final Clinical Impressions(s) / ED Diagnoses   Final diagnoses:  Contusion of right great toe without damage to nail, initial encounter    New Prescriptions New Prescriptions   No medications on file  An After Visit Summary was printed and given to the patient.   Elson Areas, PA-C 03/18/17 1020    Shaune Pollack, MD 03/19/17 [phone]

## 2017-03-18 NOTE — Discharge Instructions (Signed)
Return if any problems.

## 2017-03-18 NOTE — ED Triage Notes (Signed)
pt reports right foot injury, edema around base of right toe, sts severe pain when baring weight on the injured foot. Painful ROM of right toe.

## 2017-04-11 ENCOUNTER — Emergency Department (HOSPITAL_COMMUNITY)
Admission: EM | Admit: 2017-04-11 | Discharge: 2017-04-11 | Disposition: A | Discharge status: Home / Self Care | Payer: BC Managed Care – PPO | Attending: Emergency Medicine | Admitting: Emergency Medicine

## 2017-04-11 ENCOUNTER — Encounter (HOSPITAL_COMMUNITY): Payer: Self-pay | Admitting: Emergency Medicine

## 2017-04-11 ENCOUNTER — Emergency Department (HOSPITAL_COMMUNITY): Payer: BC Managed Care – PPO

## 2017-04-11 DIAGNOSIS — R0789 Other chest pain: Secondary | ICD-10-CM

## 2017-04-11 DIAGNOSIS — M79642 Pain in left hand: Secondary | ICD-10-CM | POA: Diagnosis not present

## 2017-04-11 DIAGNOSIS — F17210 Nicotine dependence, cigarettes, uncomplicated: Secondary | ICD-10-CM | POA: Diagnosis not present

## 2017-04-11 DIAGNOSIS — J45909 Unspecified asthma, uncomplicated: Secondary | ICD-10-CM | POA: Insufficient documentation

## 2017-04-11 DIAGNOSIS — M79641 Pain in right hand: Secondary | ICD-10-CM | POA: Diagnosis not present

## 2017-04-11 LAB — CBC
HEMATOCRIT: 40.6 % (ref 39.0–52.0)
HEMOGLOBIN: 13.6 g/dL (ref 13.0–17.0)
MCH: 27 pg (ref 26.0–34.0)
MCHC: 33.5 g/dL (ref 30.0–36.0)
MCV: 80.6 fL (ref 78.0–100.0)
Platelets: 271 10*3/uL (ref 150–400)
RBC: 5.04 MIL/uL (ref 4.22–5.81)
RDW: 13.4 % (ref 11.5–15.5)
WBC: 10 10*3/uL (ref 4.0–10.5)

## 2017-04-11 LAB — BASIC METABOLIC PANEL
ANION GAP: 8 (ref 5–15)
BUN: 12 mg/dL (ref 6–20)
CHLORIDE: 106 mmol/L (ref 101–111)
CO2: 24 mmol/L (ref 22–32)
Calcium: 9.1 mg/dL (ref 8.9–10.3)
Creatinine, Ser: 1.12 mg/dL (ref 0.61–1.24)
GFR calc Af Amer: >60 mL/min (ref >60)
GFR calc non Af Amer: >60 mL/min (ref >60)
GLUCOSE: 109 mg/dL — AB (ref 65–99)
POTASSIUM: 3.7 mmol/L (ref 3.5–5.1)
SODIUM: 138 mmol/L (ref 135–145)

## 2017-04-11 LAB — POCT I-STAT TROPONIN I: Troponin i, poc: 0 ng/mL (ref 0.00–0.08)

## 2017-04-11 NOTE — ED Triage Notes (Signed)
Pt states yesterday he had some swelling to his hands  Pt states tonight about an hour ago he started having chest pain that is a sharp pressure  Pt states his right arm feels the same

## 2017-04-11 NOTE — ED Provider Notes (Signed)
[facility]-EMERGENCY DEPT Provider Note   CSN: [id number] Arrival date & time: 04/11/17  0226     History   Chief Complaint Chief Complaint  Patient presents with  . Chest Pain  . Arm Pain    HPI Derek Good is a 26 y.o. male.  HPI Reports that last 2 days he was noticing some swelling in his hands. Right greater than left. He reports that yesterday he had worked on his car changing parts. In his job at work involves also wrist and hand motion with lifting. He was noting swelling in both hands but now it has improved. His hands were achy. That has also improved. The patient has waited in the emergency department for 5 hours to be seen and symptoms are improving. He reports he got most concerned because towards the end of work he started to feel the pain moving its way up his right forearm then to his upper arm and shoulder and then ultimately to his right upper chest. Pain was sharp and achy. It resolved since onset. No associated syncope, near syncope no associated shortness of breath. No associated lower extremity swelling. Patient has no contributory past medical history. He does smoke. Family history is negative for sudden death or early cardiac disease. Patient is able to exercise playing basketball at full capacity without chest pain or dyspnea. Past Medical History:  Diagnosis Date  . Asthma    childhood    Patient Active Problem List   Diagnosis Date Noted  . Fracture of metacarpal, first, left hand 06/29/2013  . Fracture of second metacarpal bone of left hand 06/29/2013  . Left hand pain 06/29/2013    Past Surgical History:  Procedure Laterality Date  . WRIST SURGERY         Home Medications    Prior to Admission medications   Medication Sig Start Date End Date Taking? Authorizing Provider  acetaminophen (TYLENOL) 500 MG tablet Take 1,000 mg by mouth every 6 (six) hours as needed for mild pain, moderate pain, fever or headache.    [provider]    Diphenhydramine-Phenylephrine (THERAFLU COLD/COUGH NIGHTTIME PO) Take 30 mLs by mouth daily as needed (cold symptoms).    [provider]  Guaifenesin 1200 MG TB12 Take 1 tablet (1,200 mg total) by mouth 2 (two) times daily. 06/24/16   Lawyer, Cristal Deer, PA-C  ibuprofen (ADVIL,MOTRIN) 600 MG tablet Take 1 tablet (600 mg total) by mouth every 6 (six) hours as needed. 03/18/17   Elson Areas, PA-C  promethazine (PHENERGAN) 25 MG tablet Take 1 tablet (25 mg total) by mouth every 8 (eight) hours as needed for nausea or vomiting. Patient not taking: Reported on 06/24/2016 10/12/15   Charlestine Night, PA-C  promethazine-dextromethorphan (PROMETHAZINE-DM) 6.25-15 MG/5ML syrup Take 5 mLs by mouth 4 (four) times daily as needed for cough. 06/24/16   Charlestine Night, PA-C    Family History History reviewed. No pertinent family history.  Social History Social History  Substance Use Topics  . Smoking status: Current Every Day Smoker    Types: Cigarettes, Cigars  . Smokeless tobacco: Never Used  . Alcohol use Yes     Allergies   Bee venom and Pollen extract   Review of Systems Review of Systems 10 Systems reviewed and are negative for acute change except as noted in the HPI.   Physical Exam Updated Vital Signs BP 133/76   Pulse (!) 56   Temp 98.2 F (36.8 C)   Resp (!) 24   SpO2  99%   Physical Exam  Constitutional: He is oriented to person, place, and time. He appears well-developed and well-nourished.  HENT:  Head: Normocephalic and atraumatic.  Nose: Nose normal.  Mouth/Throat: Oropharynx is clear and moist.  Eyes: Conjunctivae and EOM are normal.  Neck: Neck supple.  Cardiovascular: Normal rate, regular rhythm, normal heart sounds and intact distal pulses.   No murmur heard. Pulmonary/Chest: Effort normal and breath sounds normal. No respiratory distress. He exhibits tenderness.  Patient is reproducible chest wall pain right upper chest wall.  Abdominal:  Soft. He exhibits no distension. There is no tenderness. There is no guarding.  Musculoskeletal: Normal range of motion. He exhibits no edema or tenderness.  Examination both upper extremity shows no swelling at this time. Normal range of motion. Normal neurovascular exam. Palpation of the right arm does not show any soft tissue swelling or pain. No axillary lymphadenopathy of the right upper extremity or neck. No lower extremity edema or calf tenderness.  Neurological: He is alert and oriented to person, place, and time. No cranial nerve deficit. He exhibits normal muscle tone. Coordination normal.  Skin: Skin is warm and dry.  Psychiatric: He has a normal mood and affect.  Nursing note and vitals reviewed.    ED Treatments / Results  Labs (all labs ordered are listed, but only abnormal results are displayed) Labs Reviewed  BASIC METABOLIC PANEL - Abnormal; Notable for the following:       Result Value   Glucose, Bld 109 (*)    All other components within normal limits  CBC  I-STAT TROPONIN, ED  POCT I-STAT TROPONIN I    EKG  EKG Interpretation  Date/Time:  Saturday April 11 2017 03:11:48 EDT Ventricular Rate:  64 PR Interval:    QRS Duration: 106 QT Interval:  401 QTC Calculation: 414 R Axis:   80 Text Interpretation:  Sinus rhythm early repolarization. no change from previous Confirmed by Arby Barrette [phone]) on 04/11/2017 8:05:03 AM       Radiology Dg Chest 2 View  Result Date: 04/11/2017 CLINICAL DATA:  Right-sided chest pain. EXAM: CHEST  2 VIEW COMPARISON:  06/24/2016 FINDINGS: The cardiomediastinal contours are normal. The lungs are clear. Pulmonary vasculature is normal. No consolidation, pleural effusion, or pneumothorax. No acute osseous abnormalities are seen. IMPRESSION: No acute pulmonary process. Electronically Signed   By: Rubye Oaks M.D.   On: 04/11/2017 03:50    Procedures Procedures (including critical care time)  Medications Ordered in  ED Medications - No data to display   Initial Impression / Assessment and Plan / ED Course  I have reviewed the triage vital signs and the nursing notes.  Pertinent labs & imaging results that were available during my care of the patient were reviewed by me and considered in my medical decision making (see chart for details).      Final Clinical Impressions(s) / ED Diagnoses   Final diagnoses:  Pain in both hands  Chest wall pain  At this time, I most suspect hand symptoms of swelling and pain were associated with overuse. Patient had worked on his car before going into work and also doing repetitive hand and wrist motions. Patient is right-handed and symptoms were most pronounced in the right hand. No apparent risk factors for cardiac or pulmonary etiology. Patient was counseled on smoking cessation and healthy lifestyle for disease prevention.  New Prescriptions Discharge Medication List as of 04/11/2017  8:28 AM       Arby Barrette, MD  04/11/17 0838  

## 2017-04-11 NOTE — ED Notes (Signed)
Patient is alert and oriented x3.  He was given DC instructions and follow up visit instructions.  Patient gave verbal understanding.  He was DC ambulatory under his own power to home.  V/S stable.  He was not showing any signs of distress on DC 

## 2017-10-17 ENCOUNTER — Encounter (HOSPITAL_COMMUNITY): Payer: Self-pay

## 2017-10-17 ENCOUNTER — Other Ambulatory Visit: Payer: Self-pay

## 2017-10-17 DIAGNOSIS — R21 Rash and other nonspecific skin eruption: Secondary | ICD-10-CM | POA: Diagnosis present

## 2017-10-17 DIAGNOSIS — F17210 Nicotine dependence, cigarettes, uncomplicated: Secondary | ICD-10-CM | POA: Diagnosis not present

## 2017-10-17 NOTE — ED Triage Notes (Signed)
States rash with raised area on body for months comes and goes with itching per pt no drooling noted no wheezing noted clear speech noted no respiratory or acute distress noted.

## 2017-10-18 ENCOUNTER — Emergency Department (HOSPITAL_COMMUNITY)
Admission: EM | Admit: 2017-10-18 | Discharge: 2017-10-18 | Disposition: A | Discharge status: Home / Self Care | Payer: BC Managed Care – PPO | Attending: Emergency Medicine | Admitting: Emergency Medicine

## 2017-10-18 DIAGNOSIS — R21 Rash and other nonspecific skin eruption: Secondary | ICD-10-CM

## 2017-10-18 MED ORDER — LORATADINE 10 MG PO TABS: 10.0000 mg | ORAL_TABLET | Freq: Every day | ORAL | 0 refills | Status: DC

## 2017-10-18 MED ORDER — PREDNISONE 20 MG PO TABS: 40.0000 mg | ORAL_TABLET | Freq: Every day | ORAL | 0 refills | Status: DC

## 2017-10-18 NOTE — ED Provider Notes (Signed)
[facility]-EMERGENCY DEPT Provider Note   CSN: [id number] Arrival date & time: 10/17/17  2314     History   Chief Complaint Chief Complaint  Patient presents with  . Rash    HPI Derek Good is a 27 y.o. male.   27 year old male presents to the emergency department for evaluation of a rash.  Rash began on his chest and back and has spread to his body.  This has been present over the past few months and waxes and wanes in severity.  He reports that the rash is itching and he is unaware of any modifying factors.  He has tried hydrocortisone with little improvement.  No new soaps, lotions, detergents.  He has not had any facial swelling, difficulty breathing, difficulty swallowing, nausea, vomiting, fevers.  No recent antibiotic use.  He has previously tried Benadryl as well as a home EpiPen with no relief.  No EpiPen used PTA.     Past Medical History:  Diagnosis Date  . Asthma    childhood    Patient Active Problem List   Diagnosis Date Noted  . Fracture of metacarpal, first, left hand 06/29/2013  . Fracture of second metacarpal bone of left hand 06/29/2013  . Left hand pain 06/29/2013    Past Surgical History:  Procedure Laterality Date  . WRIST SURGERY          Home Medications    Prior to Admission medications   Medication Sig Start Date End Date Taking? Authorizing Provider  acetaminophen (TYLENOL) 500 MG tablet Take 1,000 mg by mouth every 6 (six) hours as needed for mild pain, moderate pain, fever or headache.    [provider]  Diphenhydramine-Phenylephrine (THERAFLU COLD/COUGH NIGHTTIME PO) Take 30 mLs by mouth daily as needed (cold symptoms).    [provider]  Guaifenesin 1200 MG TB12 Take 1 tablet (1,200 mg total) by mouth 2 (two) times daily. 06/24/16   Lawyer, Cristal Deer, PA-C  ibuprofen (ADVIL,MOTRIN) 600 MG tablet Take 1 tablet (600 mg total) by mouth every 6 (six) hours as needed. 03/18/17   Elson Areas, PA-C  loratadine (CLARITIN) 10 MG tablet Take 1 tablet (10 mg total) by mouth daily. 10/18/17   Antony Madura, PA-C  predniSONE (DELTASONE) 20 MG tablet Take 2 tablets (40 mg total) by mouth daily. Take 40 mg by mouth daily for 3 days, then 20mg  by mouth daily for 3 days, then 10mg  daily for 3 days 10/18/17   Antony Madura, PA-C  promethazine (PHENERGAN) 25 MG tablet Take 1 tablet (25 mg total) by mouth every 8 (eight) hours as needed for nausea or vomiting. Patient not taking: Reported on 06/24/2016 10/12/15   Charlestine Night, PA-C  promethazine-dextromethorphan (PROMETHAZINE-DM) 6.25-15 MG/5ML syrup Take 5 mLs by mouth 4 (four) times daily as needed for cough. 06/24/16   Charlestine Night, PA-C    Family History History reviewed. No pertinent family history.  Social History Social History   Tobacco Use  . Smoking status: Current Every Day Smoker    Types: Cigarettes, Cigars  . Smokeless tobacco: Never Used  Substance Use Topics  . Alcohol use: Yes  . Drug use: Yes    Types: Marijuana    Comment: last use was 3 weeks ago     Allergies   Bee venom and Pollen extract   Review of Systems Review of Systems Ten systems reviewed and are negative for acute change, except as noted in the HPI.    Physical Exam Updated  Vital Signs BP 138/65 (BP Location: Right Arm)   Pulse 65   Temp 98.2 F (36.8 C) (Oral)   Resp 17   Ht 6\' 3"  (1.905 m)   Wt 117 kg (258 lb)   SpO2 100%   BMI 32.25 kg/m   Physical Exam  Constitutional: He is oriented to person, place, and time. He appears well-developed and well-nourished. No distress.  Nontoxic appearing and in NAD  HENT:  Head: Normocephalic and atraumatic.  No stidor or tripoding. Tolerating secretions. No angioedema.  Eyes: Conjunctivae and EOM are normal. No scleral icterus.  Neck: Normal range of motion.  Cardiovascular: Normal rate, regular rhythm and intact distal pulses.  Pulmonary/Chest: Effort normal. No stridor. No  respiratory distress. He has no wheezes.  Respirations even and unlabored  Musculoskeletal: Normal range of motion.  Neurological: He is alert and oriented to person, place, and time. He exhibits normal muscle tone. Coordination normal.  Skin: Skin is warm and dry. Rash noted. He is not diaphoretic. No erythema. No pallor.  Slightly raised maculopapular rash noted diffusely to body, primarily on chest and back. Mildly erythematous, pruritic. Negative Nikolsky's sign. No bullae or vesicles. No pustules.  Psychiatric: He has a normal mood and affect. His behavior is normal.  Nursing note and vitals reviewed.    ED Treatments / Results  Labs (all labs ordered are listed, but only abnormal results are displayed) Labs Reviewed - No data to display  EKG None  Radiology No results found.  Procedures Procedures (including critical care time)  Medications Ordered in ED Medications - No data to display   Initial Impression / Assessment and Plan / ED Course  I have reviewed the triage vital signs and the nursing notes.  Pertinent labs & imaging results that were available during my care of the patient were reviewed by me and considered in my medical decision making (see chart for details).     27 year old male presents to the emergency department for evaluation of a rash which appears most consistent with urticaria.  This has been chronic and present for a few months.  He has no signs of angioedema.  No respiratory distress.  No hypoxia.  Patient is tolerating secretions without difficulty.  Will attempt management with a steroid taper.  He has been given referral to dermatology for follow-up.  I discussed possibility for future allergy testing.  Return precautions discussed and provided. Patient discharged in stable condition with no unaddressed concerns.   Final Clinical Impressions(s) / ED Diagnoses   Final diagnoses:  Rash and nonspecific skin eruption    ED Discharge Orders         Ordered    loratadine (CLARITIN) 10 MG tablet  Daily     10/18/17 0259    predniSONE (DELTASONE) 20 MG tablet  Daily     10/18/17 0259       Antony MaduraHumes, Emberlin Verner, PA-C 10/18/17 2135    Palumbo, April, MD 10/18/17 2310

## 2017-10-18 NOTE — ED Notes (Signed)
Bed: WA06 Expected date:  Expected time:  Means of arrival:  Comments: 

## 2019-03-05 IMAGING — CR DG FOOT COMPLETE 3+V*R*
3 series · 3 of 3 positions shown · non-contrast
Comparison: None.

CLINICAL DATA: Pain following fall on steps

EXAM:
RIGHT FOOT COMPLETE - 3+ VIEW

[x foot ap right]
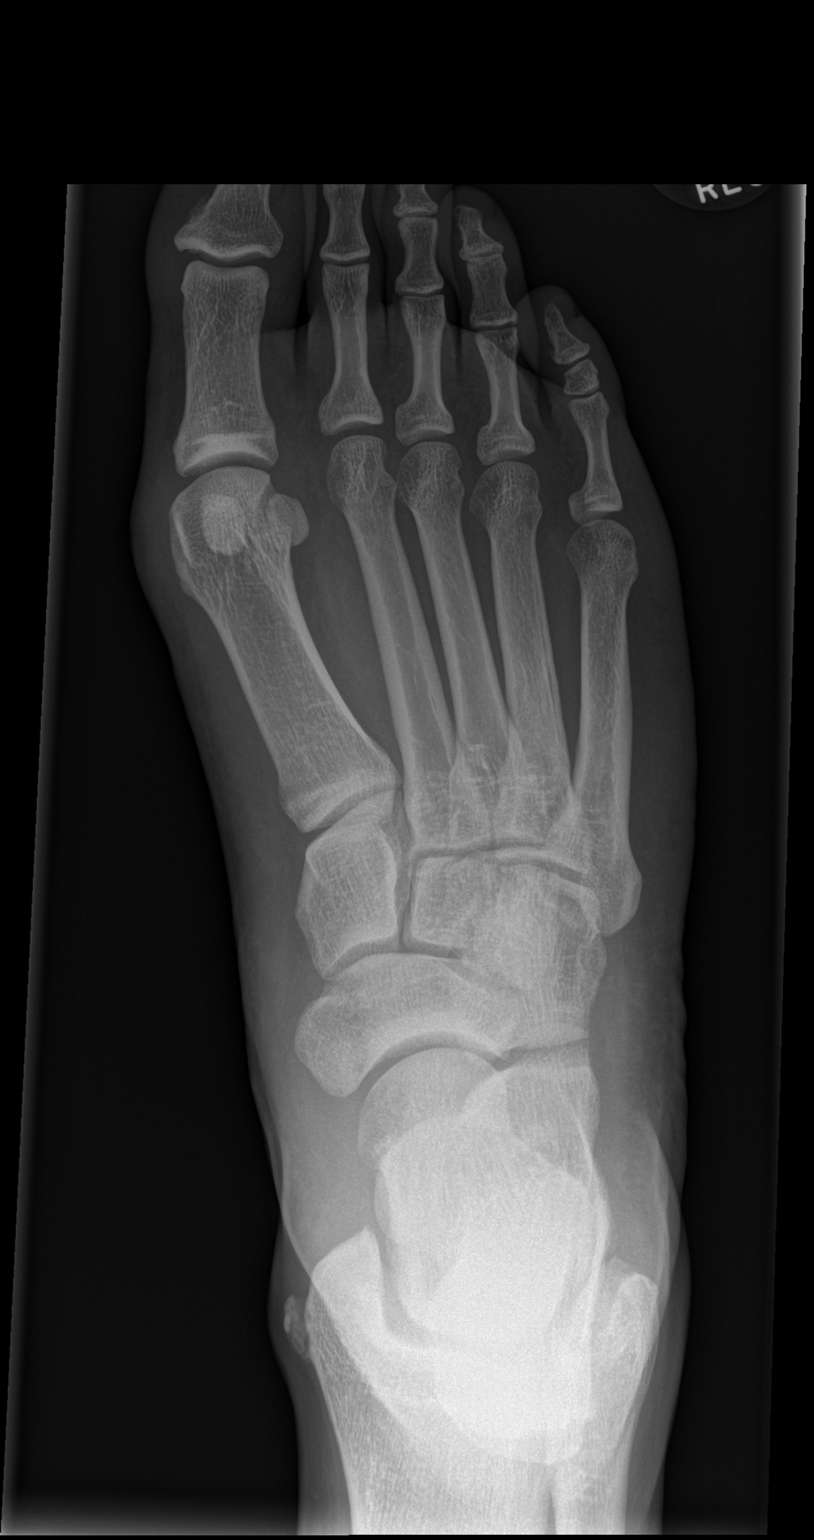

[x foot obl right]
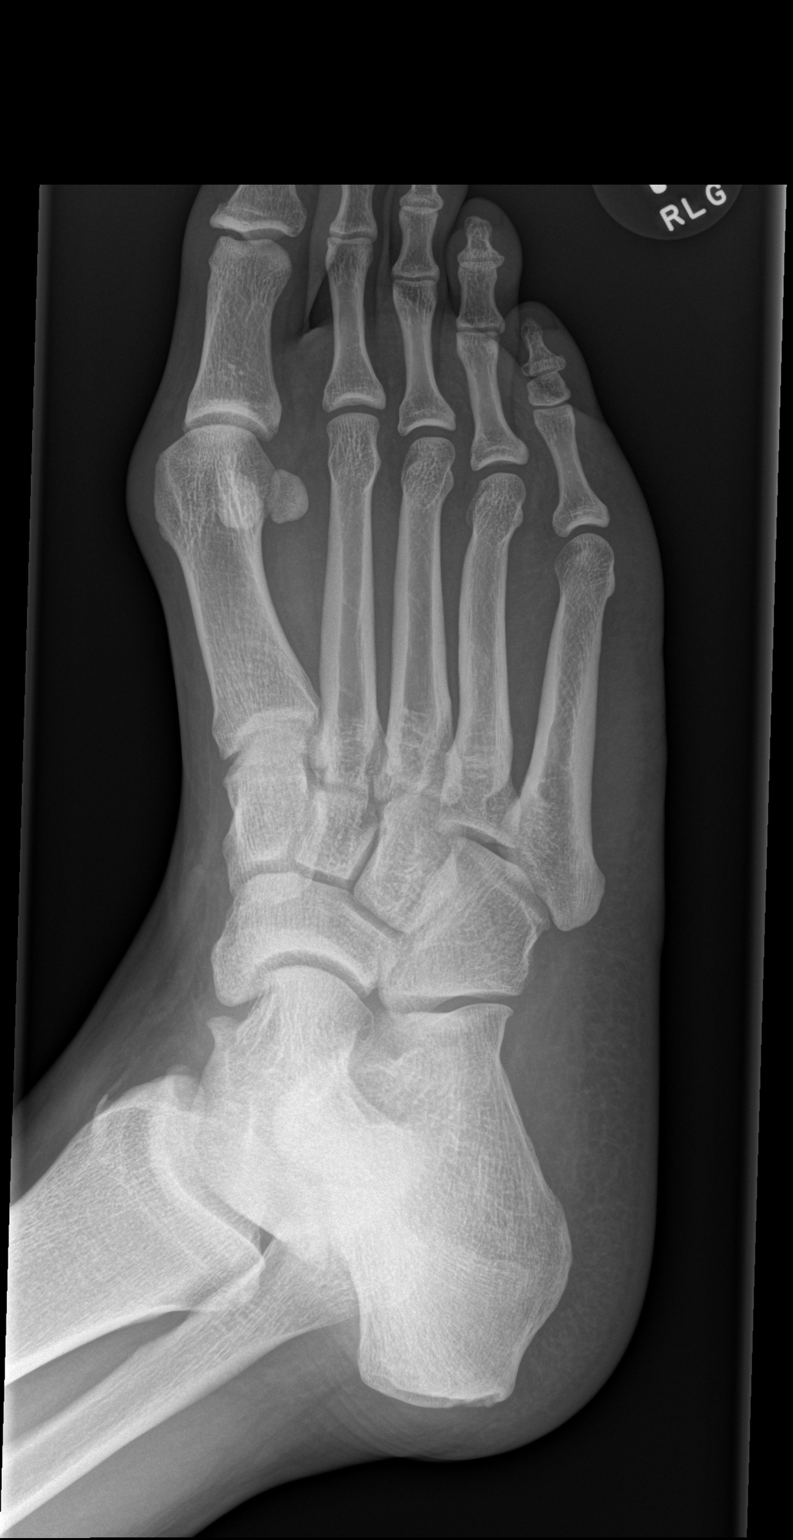

[x foot lat right]
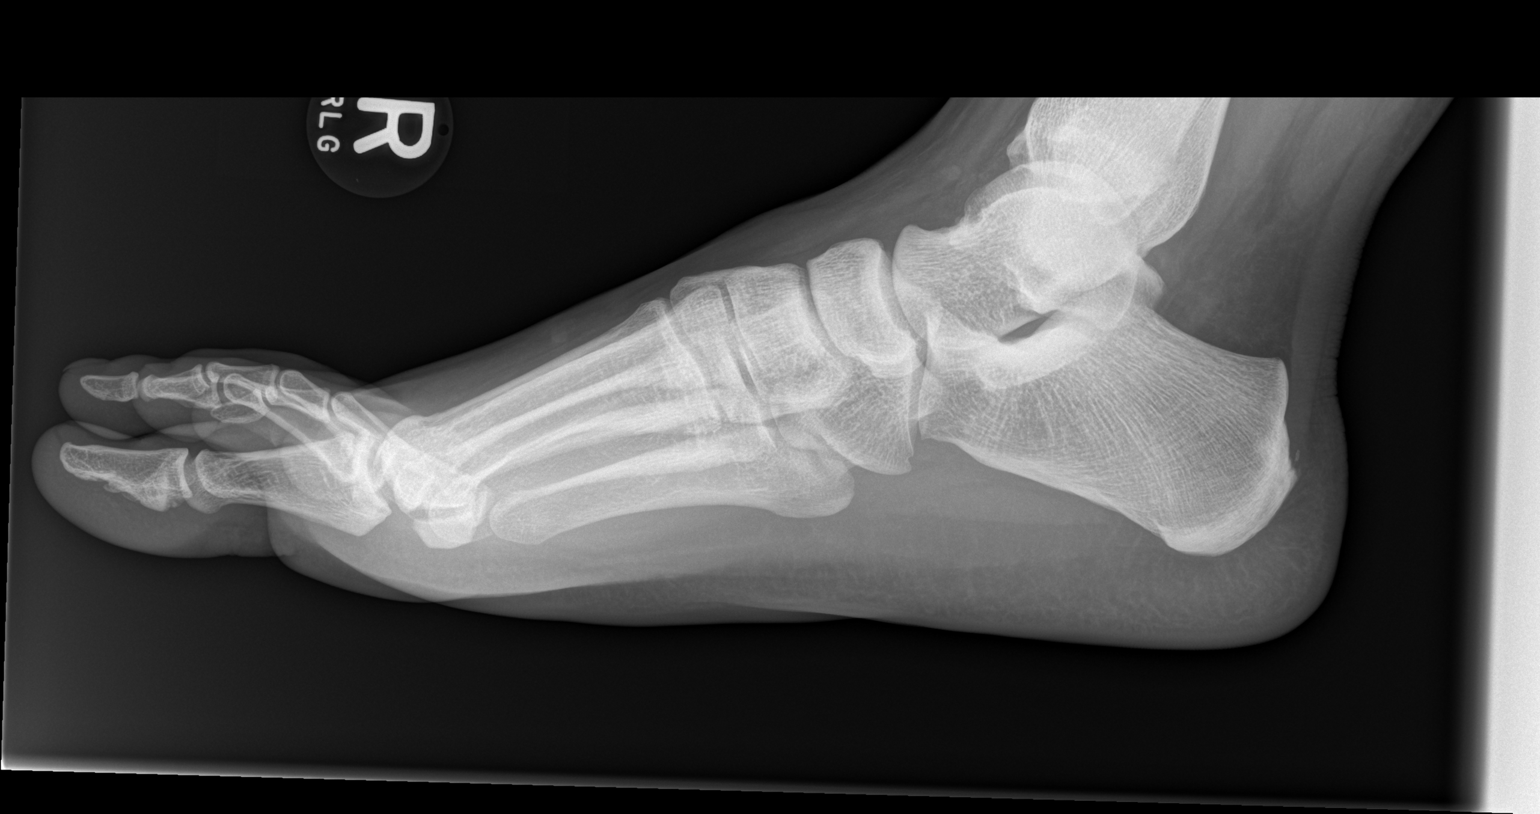

[3 of 3 positions shown; findings below may reference images not displayed]

FINDINGS: Frontal, oblique, and lateral views were obtained. There is soft
tissue swelling medial to the first MTP joint. There appears to be
early bunion formation in this area. There is no fracture or
dislocation. Joint spaces appear normal. No erosive change. There is
calcification medial to the medial malleolus, possibly residua of
old trauma.
IMPRESSION: No acute fracture or dislocation. Question residua of old trauma
medial to the medial malleolus. No appreciable joint space
narrowing. Suspect early bunion formation medial to the first MTP
joint.

## 2020-06-25 ENCOUNTER — Encounter (HOSPITAL_COMMUNITY): Payer: Self-pay

## 2020-06-25 ENCOUNTER — Other Ambulatory Visit: Payer: Self-pay

## 2020-06-25 ENCOUNTER — Emergency Department (HOSPITAL_COMMUNITY): Payer: No Typology Code available for payment source

## 2020-06-25 ENCOUNTER — Emergency Department (HOSPITAL_COMMUNITY)
Admission: EM | Admit: 2020-06-25 | Discharge: 2020-06-25 | Disposition: A | Discharge status: Home / Self Care | Payer: No Typology Code available for payment source | Attending: Emergency Medicine | Admitting: Emergency Medicine

## 2020-06-25 DIAGNOSIS — S0990XA Unspecified injury of head, initial encounter: Secondary | ICD-10-CM | POA: Insufficient documentation

## 2020-06-25 DIAGNOSIS — F17210 Nicotine dependence, cigarettes, uncomplicated: Secondary | ICD-10-CM | POA: Diagnosis not present

## 2020-06-25 DIAGNOSIS — Z23 Encounter for immunization: Secondary | ICD-10-CM | POA: Insufficient documentation

## 2020-06-25 DIAGNOSIS — S0993XA Unspecified injury of face, initial encounter: Secondary | ICD-10-CM | POA: Diagnosis present

## 2020-06-25 DIAGNOSIS — S01511A Laceration without foreign body of lip, initial encounter: Secondary | ICD-10-CM | POA: Insufficient documentation

## 2020-06-25 DIAGNOSIS — J45909 Unspecified asthma, uncomplicated: Secondary | ICD-10-CM | POA: Insufficient documentation

## 2020-06-25 DIAGNOSIS — S025XXB Fracture of tooth (traumatic), initial encounter for open fracture: Secondary | ICD-10-CM | POA: Diagnosis not present

## 2020-06-25 DIAGNOSIS — Y92410 Unspecified street and highway as the place of occurrence of the external cause: Secondary | ICD-10-CM | POA: Diagnosis not present

## 2020-06-25 MED ORDER — LIDOCAINE-EPINEPHRINE 2 %-1:100000 IJ SOLN
20.0000 mL | Freq: Once | INTRAMUSCULAR | Status: AC
  Administered 2020-06-25: 15:00:00 20 mL
  Filled 2020-06-25: qty 1

## 2020-06-25 MED ORDER — TETANUS-DIPHTH-ACELL PERTUSSIS 5-2.5-18.5 LF-MCG/0.5 IM SUSY
0.5000 mL | PREFILLED_SYRINGE | Freq: Once | INTRAMUSCULAR | Status: AC
  Administered 2020-06-25: 15:00:00 0.5 mL via INTRAMUSCULAR
  Filled 2020-06-25: qty 0.5

## 2020-06-25 MED ORDER — TRAMADOL HCL 50 MG PO TABS
50.0000 mg | ORAL_TABLET | Freq: Once | ORAL | Status: AC
  Administered 2020-06-25: 15:00:00 50 mg via ORAL
  Filled 2020-06-25: qty 1

## 2020-06-25 MED ORDER — TRAMADOL HCL 50 MG PO TABS: 50.0000 mg | ORAL_TABLET | Freq: Four times a day (QID) | ORAL | 0 refills | Status: AC | PRN
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
## 2020-06-25 NOTE — ED Notes (Signed)
Patient transported to CT 

## 2020-06-25 NOTE — ED Notes (Signed)
Suture cart and lidocaine at bedside.  

## 2020-06-25 NOTE — Discharge Instructions (Signed)
You have been seen and discharged from the emergency department.  You are to follow-up with Dr. Mayford Knife at his dentistry office at 345 for evaluation of the fractured and loose teeth.  Follow-up with your primary provider for other reevaluation. Take medications as prescribed. If you have any worsening symptoms or further concerns or health please return to emergency department for further evaluation.

## 2020-06-29 ENCOUNTER — Emergency Department (HOSPITAL_COMMUNITY)
Admission: EM | Admit: 2020-06-29 | Discharge: 2020-06-29 | Disposition: A | Discharge status: Home / Self Care | Payer: Self-pay | Attending: Emergency Medicine | Admitting: Emergency Medicine

## 2020-06-29 ENCOUNTER — Other Ambulatory Visit: Payer: Self-pay

## 2020-06-29 ENCOUNTER — Encounter (HOSPITAL_COMMUNITY): Payer: Self-pay | Admitting: Emergency Medicine

## 2020-06-29 DIAGNOSIS — F1590 Other stimulant use, unspecified, uncomplicated: Secondary | ICD-10-CM | POA: Insufficient documentation

## 2020-06-29 DIAGNOSIS — S01511A Laceration without foreign body of lip, initial encounter: Secondary | ICD-10-CM | POA: Insufficient documentation

## 2020-06-29 DIAGNOSIS — F17290 Nicotine dependence, other tobacco product, uncomplicated: Secondary | ICD-10-CM | POA: Insufficient documentation

## 2020-06-29 DIAGNOSIS — F17210 Nicotine dependence, cigarettes, uncomplicated: Secondary | ICD-10-CM | POA: Insufficient documentation

## 2020-06-29 DIAGNOSIS — J45909 Unspecified asthma, uncomplicated: Secondary | ICD-10-CM | POA: Insufficient documentation

## 2020-06-29 DIAGNOSIS — L089 Local infection of the skin and subcutaneous tissue, unspecified: Secondary | ICD-10-CM

## 2020-06-29 MED ORDER — CHLORHEXIDINE GLUCONATE 0.12 % MT SOLN: 15.0000 mL | Freq: Two times a day (BID) | OROMUCOSAL | 0 refills | Status: AC

## 2020-06-29 MED ORDER — AMOXICILLIN-POT CLAVULANATE 875-125 MG PO TABS: 1.0000 | ORAL_TABLET | Freq: Two times a day (BID) | ORAL | 0 refills | Status: AC
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
## 2020-06-29 NOTE — Discharge Instructions (Addendum)
You were seen in the emergency department today due to concern for your lip laceration.  We suspect this is infected.  We are sending you home with Peridex mouthwash to use twice per day for the next 1 week as well as Augmentin to take twice per day for the next 1 week.  Please take all of your antibiotics until finished. You may develop abdominal discomfort or diarrhea from the antibiotic.  You may help offset this with probiotics which you can buy at the store (ask your pharmacist if unable to find) or get probiotics in the form of eating yogurt. Do not eat or take the probiotics until 2 hours after your antibiotic. If you are unable to tolerate these side effects follow-up with your primary care provider or return to the emergency department.   If you begin to experience any blistering, rashes, swelling, or difficulty breathing seek medical care for evaluation of potentially more serious side effects.   Please be aware that this medication may interact with other medications you are taking, please be sure to discuss your medication list with your pharmacist. If you are taking birth control the antibiotic will deactivate your birth control for 2 weeks. If on coumadin the antibiotic will effect your coumadin level.    We would like you to follow-up with a dentist you recently saw or with Dr. Barbette Merino, our oral surgeon, within the next 1 to 3 days for recheck of this area.  Return to the ER for any new or worsening symptoms including but not limited to fever, lower lip swelling, redness, trouble breathing, pain/swelling beneath your tongue, neck stiffness, or any other concerns.
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
## 2022-08-13 ENCOUNTER — Emergency Department (HOSPITAL_COMMUNITY)
Admission: EM | Admit: 2022-08-13 | Discharge: 2022-08-13 | Disposition: A | Discharge status: Home / Self Care | Payer: Self-pay | Attending: Emergency Medicine | Admitting: Emergency Medicine

## 2022-08-13 ENCOUNTER — Other Ambulatory Visit: Payer: Self-pay

## 2022-08-13 DIAGNOSIS — T510X1A Toxic effect of ethanol, accidental (unintentional), initial encounter: Secondary | ICD-10-CM | POA: Insufficient documentation

## 2022-08-13 DIAGNOSIS — Z77098 Contact with and (suspected) exposure to other hazardous, chiefly nonmedicinal, chemicals: Secondary | ICD-10-CM

## 2022-08-13 MED ORDER — TETRACAINE HCL 0.5 % OP SOLN
1.0000 [drp] | Freq: Once | OPHTHALMIC | Status: AC
  Administered 2022-08-13: 1 [drp] via OPHTHALMIC
  Filled 2022-08-13: qty 4

## 2022-08-13 MED ORDER — POLYMYXIN B-TRIMETHOPRIM 10000-0.1 UNIT/ML-% OP SOLN
1.0000 [drp] | OPHTHALMIC | Status: DC
  Administered 2022-08-13: 1 [drp] via OPHTHALMIC
  Filled 2022-08-13: qty 10

## 2022-08-13 MED ORDER — FLUORESCEIN SODIUM 1 MG OP STRP
1.0000 | ORAL_STRIP | Freq: Once | OPHTHALMIC | Status: AC
  Administered 2022-08-13: 1 via OPHTHALMIC
  Filled 2022-08-13: qty 1

## 2022-08-13 MED ORDER — KETOROLAC TROMETHAMINE 0.5 % OP SOLN
1.0000 [drp] | Freq: Three times a day (TID) | OPHTHALMIC | Status: DC
  Administered 2022-08-13: 1 [drp] via OPHTHALMIC
  Filled 2022-08-13: qty 5

## 2022-08-13 NOTE — ED Provider Notes (Signed)
[facility] Provider Note   CSN: [id number] Arrival date & time: 08/13/22  0142     History  Chief Complaint  Patient presents with   Eye Irritation     Derek Good is a 32 y.o. male.  The history is provided by the patient and medical records.   32 y.o. M here with left eye pain/irritation after having hand sanitizer splash into his left eye yesterday at work.  States he washed it out with some water but still having irritation.  States worse tonight after cooking over hot grill, not sure if the steam/heat aggravated it more.  Denies visual disturbance or vision loss.  Does not wear glasses or corrective lenses.    Home Medications Prior to Admission medications   Medication Sig Start Date End Date Taking? Authorizing Provider  acetaminophen (TYLENOL) 500 MG tablet Take 1,000 mg by mouth every 6 (six) hours as needed for mild pain, moderate pain, fever or headache.    [provider]  amoxicillin-clavulanate (AUGMENTIN) 875-125 MG tablet Take 1 tablet by mouth every 12 (twelve) hours. 06/29/20   Petrucelli, Samantha R, PA-C  chlorhexidine (PERIDEX) 0.12 % solution Use as directed 15 mLs in the mouth or throat 2 (two) times daily. 06/29/20   Petrucelli, Samantha R, PA-C  traMADol (ULTRAM) 50 MG tablet Take 1 tablet (50 mg total) by mouth every 6 (six) hours as needed. 06/25/20   Horton, Drue Dun M, DO  loratadine (CLARITIN) 10 MG tablet Take 1 tablet (10 mg total) by mouth daily. Patient not taking: No sig reported 10/18/17 06/29/20  Antonietta Breach, PA-C  promethazine (PHENERGAN) 25 MG tablet Take 1 tablet (25 mg total) by mouth every 8 (eight) hours as needed for nausea or vomiting. Patient not taking: No sig reported 10/12/15 06/29/20  Lawyer, Harrell Gave, PA-C      Allergies    Bee venom and Pollen extract    Review of Systems   Review of Systems  Eyes:  Positive for pain and redness.  All other systems reviewed and  are negative.   Physical Exam Updated Vital Signs BP 139/79 (BP Location: Right Arm)   Pulse 72   Temp 98.5 F (36.9 C) (Oral)   Resp 16   SpO2 100%   Physical Exam Vitals and nursing note reviewed.  Constitutional:      Appearance: He is well-developed.  HENT:     Head: Normocephalic and atraumatic.  Eyes:     Conjunctiva/sclera: Conjunctivae normal.     Pupils: Pupils are equal, round, and reactive to light.     Comments: Very mild edema of left upper eyelid, left conjunctiva injected and eye is tearing, EOMs intact, no nystagmus, pH normal at 7, fluorescein stain without abrasion or ulcer noted, no uptake  Cardiovascular:     Rate and Rhythm: Normal rate and regular rhythm.     Heart sounds: Normal heart sounds.  Pulmonary:     Effort: Pulmonary effort is normal.     Breath sounds: Normal breath sounds.  Abdominal:     General: Bowel sounds are normal.     Palpations: Abdomen is soft.  Musculoskeletal:        General: Normal range of motion.     Cervical back: Normal range of motion.  Skin:    General: Skin is warm and dry.  Neurological:     Mental Status: He is alert and oriented to person, place, and time.     ED  Results / Procedures / Treatments   Labs (all labs ordered are listed, but only abnormal results are displayed) Labs Reviewed - No data to display  EKG None  Radiology No results found.  Procedures Procedures    Medications Ordered in ED Medications  ketorolac (ACULAR) 0.5 % ophthalmic solution 1 drop (1 drop Right Eye Given 08/13/22 0329)  trimethoprim-polymyxin b (POLYTRIM) ophthalmic solution 1 drop (1 drop Right Eye Given 08/13/22 0328)  tetracaine (PONTOCAINE) 0.5 % ophthalmic solution 1 drop (1 drop Left Eye Given by Other 08/13/22 0313)  fluorescein ophthalmic strip 1 strip (1 strip Left Eye Given by Other 08/13/22 0355)    ED Course/ Medical Decision Making/ A&P                             Medical Decision  Making Risk Prescription drug management.   32 y.o. M here with left eye irritation and tearing after getting hand sanitizer in his left eye yesterday.  Worse today after cooking over hot grill.  He has a very mild left upper eyelid edema without erythema.  His conjunctiva is injected and tearing.  pH normal at 7.  Fluorescein stain without any signs of corneal abrasion, ulcer, or uptake.  Suspect this is likely just chemical irritation.  Will start on Acular and Polytrim drops, given ophthalmology follow-up.  Can return here for new concerns.  Final Clinical Impression(s) / ED Diagnoses Final diagnoses:  Chemical exposure of eye    Rx / DC Orders ED Discharge Orders     None         Larene Pickett, PA-C 08/13/22 0436    Orpah Greek, MD 08/14/22 [phone]

## 2022-08-13 NOTE — ED Triage Notes (Signed)
Patient reports hand sanitizer splattered at left eye yesterday afternoon , presents with left eye redness/eyelid swelling , no vision loss.

## 2022-08-13 NOTE — Discharge Instructions (Addendum)
Continue using eye drops as directed. Follow-up with eye doctor if ongoing issues. Return to the ED for new or worsening symptoms.

## 2024-02-22 ENCOUNTER — Emergency Department (HOSPITAL_COMMUNITY)
Admission: EM | Admit: 2024-02-22 | Discharge: 2024-02-22 | Disposition: A | Discharge status: Home / Self Care | Payer: Self-pay | Attending: Emergency Medicine | Admitting: Emergency Medicine

## 2024-02-22 ENCOUNTER — Other Ambulatory Visit: Payer: Self-pay

## 2024-02-22 ENCOUNTER — Encounter (HOSPITAL_COMMUNITY): Payer: Self-pay | Admitting: *Deleted

## 2024-02-22 DIAGNOSIS — Z0189 Encounter for other specified special examinations: Secondary | ICD-10-CM

## 2024-02-22 DIAGNOSIS — M5442 Lumbago with sciatica, left side: Secondary | ICD-10-CM | POA: Insufficient documentation

## 2024-02-22 DIAGNOSIS — M5432 Sciatica, left side: Secondary | ICD-10-CM

## 2024-02-22 DIAGNOSIS — J45909 Unspecified asthma, uncomplicated: Secondary | ICD-10-CM | POA: Insufficient documentation

## 2024-02-22 MED ORDER — METHOCARBAMOL 500 MG PO TABS: 500.0000 mg | ORAL_TABLET | Freq: Two times a day (BID) | ORAL | 0 refills | Status: AC

## 2024-02-22 MED ORDER — KETOROLAC TROMETHAMINE 15 MG/ML IJ SOLN
30.0000 mg | Freq: Once | INTRAMUSCULAR | Status: AC
  Administered 2024-02-22 (×2): 30 mg via INTRAMUSCULAR
  Filled 2024-02-22: qty 2

## 2024-02-22 NOTE — Discharge Instructions (Signed)
 You were seen today for lower back pain. While you were here we monitored your vitals, preformed a physical exam.  I believe your pain is likely due to sciatic nerve compression.  This does not require emergent treatment.  You received anti-inflammatory shot and a prescription for muscle relaxers.  Things to do:  - Follow up with your primary care provider within the next 1-2 weeks - He should receive a call from physical therapy they may help you with exercises and stretches that may improve your symptoms.  Return to the emergency department if you have any new or worsening symptoms including weakness in your left foot or leg.  Difficulty ambulating, fevers, or if you have any other concerns.

## 2024-02-22 NOTE — ED Triage Notes (Signed)
 C/o lower back pain onset this am denies injury, states the pain is a sharp pain radiates down his left leg, his leg is tingling and feels numb.

## 2024-02-22 NOTE — ED Notes (Signed)
 EDP at [facility]

## 2024-02-22 NOTE — ED Provider Notes (Signed)
 [facility] Provider Note  MDM   HPI/ROS:  Derek Good is a 33 y.o. male with a medical history as below who presents with left-sided lower back pain that radiates into his leg.  He describes a shooting pain with some associated numbness and tingling.  He states the numbness is relatively short-lived but he has some tingling afterwards.  Denies any difficulty ambulating.  Denies any traumatic injury.  States he has been having pain for a few days however the numbness is new today.  Physical exam is notable for: - Tenderness to palpation in the right upper glutes.  Straight leg test positive.  Good 2-point discrimination with 5 out of 5 strength in bilateral lower extremities  On my initial evaluation, patient is:  -Vital signs stable. Patient afebrile, hemodynamically stable, and non-toxic appearing.   Differentials include sciatica, radiculopathy, disc herniation, critical spinal cord stenosis, malignancy.    Physical exam is most consistent with sciatica given positive straight leg test.  Radiculopathy secondary to disc herniation also remains on the differential but he has no motor involvement.  He is endorsing some paresthesias however good 2 point discrimination.  He has no midline tenderness or red flag symptoms that would be concerning for epidural abscess/malignancy.  I do not believe MRI was warranted in the emergency department but did discuss the potential need for it if his symptoms do not resolve.  He reports that he can see his primary care doctor if he does not feel any better.  Toradol  was given.  He was discharged in stable condition with strict return precautions.   Disposition:  I discussed the plan for discharge with the patient and/or their surrogate at bedside prior to discharge and they were in agreement with the plan and verbalized understanding of the return precautions provided. All questions answered to the best of  my ability. Ultimately, the patient was discharged in stable condition with stable vital signs. I am reassured that they are capable of close follow up and good social support at home.   Clinical Impression:  1. Physical therapy evaluation, initial   2. Sciatica of left side     Rx / DC Orders ED Discharge Orders          Ordered    methocarbamol  (ROBAXIN ) 500 MG tablet  2 times daily        02/22/24 1045            The plan for this patient was discussed with Dr. Bernard, who voiced agreement and who oversaw evaluation and treatment of this patient.   Clinical Complexity A medically appropriate history, review of systems, and physical exam was performed.  My independent interpretations of EKG, labs, and radiology are documented in the ED course above.   If decision rules were used in this patient's evaluation, they are listed below.   Click here for ABCD2, HEART and other calculatorsREFRESH Note before signing   Patient's presentation is most consistent with acute complicated illness / injury requiring diagnostic workup.  Medical Decision Making Risk Prescription drug management.    HPI/ROS      See MDM section for pertinent HPI and ROS. A complete ROS was performed with pertinent positives/negatives noted above.   Past Medical History:  Diagnosis Date   Asthma    childhood    Past Surgical History:  Procedure Laterality Date   WRIST SURGERY        Physical Exam   Vitals:  02/22/24 1008 02/22/24 1010 02/22/24 1034  BP: (!) 144/88  118/64  Pulse: 79  65  Resp: 16  13  Temp: 97.9 F (36.6 C)  97.8 F (36.6 C)  TempSrc:   Oral  SpO2: 99%  99%  Weight:  108.9 kg   Height:  6' 4 (1.93 m)     Physical Exam Vitals and nursing note reviewed.  Constitutional:      General: He is not in acute distress.    Appearance: He is well-developed.  HENT:     Head: Normocephalic and atraumatic.  Eyes:     Conjunctiva/sclera: Conjunctivae normal.   Cardiovascular:     Rate and Rhythm: Normal rate and regular rhythm.     Heart sounds: No murmur heard. Pulmonary:     Effort: Pulmonary effort is normal. No respiratory distress.     Breath sounds: Normal breath sounds.  Abdominal:     Palpations: Abdomen is soft.     Tenderness: There is no abdominal tenderness.  Musculoskeletal:        General: No swelling.     Cervical back: Neck supple.     Comments: Tenderness to palpation in the left lumbar extending to the upper gluteal muscle group.  Positive straight leg test.  5 out of 5 strength throughout with good to point discrimination.  No obvious swelling, redness or traumatic injury.  No midline lumbar spine tenderness  Skin:    General: Skin is warm and dry.     Capillary Refill: Capillary refill takes less than 2 seconds.  Neurological:     Mental Status: He is alert.     Sensory: Sensation is intact.     Motor: Motor function is intact.     Gait: Gait is intact.  Psychiatric:        Mood and Affect: Mood normal.      Procedures   If procedures were preformed on this patient, they are listed below:  Procedures   @BBSIG @   Please note that this documentation was produced with the assistance of voice-to-text technology and may contain errors.    Sharyne Darina RAMAN, MD 02/22/24 1129    Bernard Drivers, MD 02/23/24 1459
